# Patient Record
Sex: Female | Born: 1980 | Race: White | Hispanic: No | Marital: Married | Smoking: Never smoker
Health system: Southern US, Community
[De-identification: ages and names within clinical notes are randomized; demographics above are authoritative.]

## PROBLEM LIST (undated history)

## (undated) ENCOUNTER — Inpatient Hospital Stay (HOSPITAL_COMMUNITY): Payer: Self-pay

## (undated) DIAGNOSIS — F419 Anxiety disorder, unspecified: Secondary | ICD-10-CM

## (undated) DIAGNOSIS — Z789 Other specified health status: Secondary | ICD-10-CM

## (undated) DIAGNOSIS — O4290 Premature rupture of membranes, unspecified as to length of time between rupture and onset of labor, unspecified weeks of gestation: Secondary | ICD-10-CM

## (undated) DIAGNOSIS — O039 Complete or unspecified spontaneous abortion without complication: Secondary | ICD-10-CM

---

## 2012-07-16 NOTE — L&D Delivery Note (Signed)
Delivery Note At 8:02 AM a viable and healthy female was delivered via VBAC, Spontaneous (Presentation: Right Occiput Anterior).  APGAR: 9, 9; weight 8 lb 1 oz (3657 g).   Placenta status: Intact, Spontaneous.  Not sent Cord: 3 vessels with the following complications: .  Cord pH: none  Anesthesia: Epidural Local  Episiotomy:  Lacerations: 2nd degree;Perineal Suture Repair: 3.0 chromic Est. Blood Loss (mL): 300  Mom to postpartum.  Baby to Couplet care / Skin to Skin.  [name] A 07/13/2013, 9:50 AM

## 2012-12-18 LAB — OB RESULTS CONSOLE ABO/RH: RH Type: POSITIVE

## 2012-12-18 LAB — OB RESULTS CONSOLE HIV ANTIBODY (ROUTINE TESTING): HIV: NONREACTIVE

## 2012-12-18 LAB — OB RESULTS CONSOLE ANTIBODY SCREEN: Antibody Screen: NEGATIVE

## 2012-12-30 LAB — OB RESULTS CONSOLE GC/CHLAMYDIA
Chlamydia: NEGATIVE
Gonorrhea: NEGATIVE

## 2013-01-06 ENCOUNTER — Encounter (HOSPITAL_COMMUNITY): Payer: Self-pay

## 2013-01-06 ENCOUNTER — Inpatient Hospital Stay (HOSPITAL_COMMUNITY): Payer: BC Managed Care – PPO

## 2013-01-06 ENCOUNTER — Inpatient Hospital Stay (HOSPITAL_COMMUNITY)
Admission: AD | Admit: 2013-01-06 | Discharge: 2013-01-06 | Disposition: A | Discharge status: Home / Self Care | Payer: BC Managed Care – PPO | Source: Ambulatory Visit | Attending: Obstetrics & Gynecology | Admitting: Obstetrics & Gynecology

## 2013-01-06 DIAGNOSIS — O209 Hemorrhage in early pregnancy, unspecified: Secondary | ICD-10-CM | POA: Insufficient documentation

## 2013-01-06 HISTORY — DX: Complete or unspecified spontaneous abortion without complication: O03.9

## 2013-01-06 NOTE — MAU Note (Signed)
Dr. Seymour Bars at the bedside.

## 2013-01-06 NOTE — MAU Note (Signed)
Patient state she went to the bathroom about one hour ago and had bright bleeding. Denies pain.

## 2013-01-06 NOTE — MAU Note (Signed)
Dr. Seymour Bars notified of U/S results.  May Discharge pt to home.  Pelvic rest.

## 2013-02-04 NOTE — MAU Provider Note (Signed)
History  Mariah Marshall 32 y.o. [id number]      Chief Complaint  Patient presents with  . Vaginal Bleeding    Past Medical History  Diagnosis Date  . Miscarriage     Past Surgical History  Procedure Laterality Date  . Cesarean section      History reviewed. No pertinent family history.  History  Substance Use Topics  . Smoking status: Never Smoker   . Smokeless tobacco: Not on file  . Alcohol Use: No    Allergies: No Known Allergies  No prescriptions prior to admission     Physical Exam   Blood pressure 103/63, pulse 69, resp. rate 16, height 5\' 4"  (1.626 m), weight 56.155 kg (123 lb 12.8 oz), SpO2 91.00%.  Abdo wnl No vaginal bleeding. [place] SIUP 12+ wks, FHR 165/min, small SCH.  ED Course  Stable, no vaginal bleeding  A/P 12 5/7 wks with 1 episode of spotting.  No active bleeding.  [place] sIUP 12 5/7 wks FHR 165/min, small SCH.  Precautions discussed.  F/U [facility].  Genia Del MD

## 2013-07-03 LAB — OB RESULTS CONSOLE GBS: GBS: NEGATIVE

## 2013-07-13 ENCOUNTER — Inpatient Hospital Stay (HOSPITAL_COMMUNITY)
Admission: AD | Admit: 2013-07-13 | Discharge: 2013-07-14 | DRG: 775 | Disposition: A | Discharge status: Home / Self Care | Payer: BC Managed Care – PPO | Source: Ambulatory Visit | Attending: Obstetrics and Gynecology | Admitting: Obstetrics and Gynecology

## 2013-07-13 ENCOUNTER — Inpatient Hospital Stay (HOSPITAL_COMMUNITY): Payer: BC Managed Care – PPO | Admitting: Anesthesiology

## 2013-07-13 ENCOUNTER — Encounter (HOSPITAL_COMMUNITY): Payer: Self-pay

## 2013-07-13 ENCOUNTER — Encounter (HOSPITAL_COMMUNITY): Payer: BC Managed Care – PPO | Admitting: Anesthesiology

## 2013-07-13 DIAGNOSIS — O34219 Maternal care for unspecified type scar from previous cesarean delivery: Secondary | ICD-10-CM | POA: Diagnosis present

## 2013-07-13 HISTORY — DX: Other specified health status: Z78.9

## 2013-07-13 LAB — CBC
MCH: 30.6 pg (ref 26.0–34.0)
MCHC: 34.8 g/dL (ref 30.0–36.0)
Platelets: 173 10*3/uL (ref 150–400)
RBC: 4.87 MIL/uL (ref 3.87–5.11)
RDW: 14.2 % (ref 11.5–15.5)

## 2013-07-13 LAB — TYPE AND SCREEN
ABO/RH(D): A POS
Antibody Screen: NEGATIVE

## 2013-07-13 LAB — RPR: RPR Ser Ql: NONREACTIVE

## 2013-07-13 MED ORDER — EPHEDRINE 5 MG/ML INJ
10.0000 mg | INTRAVENOUS | Status: DC | PRN
  Filled 2013-07-13: qty 4
  Filled 2013-07-13: qty 2

## 2013-07-13 MED ORDER — PHENYLEPHRINE 40 MCG/ML (10ML) SYRINGE FOR IV PUSH (FOR BLOOD PRESSURE SUPPORT)
80.0000 ug | PREFILLED_SYRINGE | INTRAVENOUS | Status: DC | PRN
  Filled 2013-07-13: qty 10
  Filled 2013-07-13: qty 2

## 2013-07-13 MED ORDER — IBUPROFEN 600 MG PO TABS
600.0000 mg | ORAL_TABLET | Freq: Four times a day (QID) | ORAL | Status: DC
  Administered 2013-07-13 – 2013-07-14 (×5): 600 mg via ORAL
  Filled 2013-07-13 (×6): qty 1

## 2013-07-13 MED ORDER — LACTATED RINGERS IV SOLN: INTRAVENOUS | Status: DC

## 2013-07-13 MED ORDER — PHENYLEPHRINE 40 MCG/ML (10ML) SYRINGE FOR IV PUSH (FOR BLOOD PRESSURE SUPPORT)
80.0000 ug | PREFILLED_SYRINGE | INTRAVENOUS | Status: DC | PRN
  Filled 2013-07-13: qty 2

## 2013-07-13 MED ORDER — DIPHENHYDRAMINE HCL 25 MG PO CAPS: 25.0000 mg | ORAL_CAPSULE | Freq: Four times a day (QID) | ORAL | Status: DC | PRN

## 2013-07-13 MED ORDER — LIDOCAINE HCL (PF) 1 % IJ SOLN
INTRAMUSCULAR | Status: DC | PRN
  Administered 2013-07-13 (×3): 5 mL

## 2013-07-13 MED ORDER — CITRIC ACID-SODIUM CITRATE 334-500 MG/5ML PO SOLN: 30.0000 mL | ORAL | Status: DC | PRN

## 2013-07-13 MED ORDER — WITCH HAZEL-GLYCERIN EX PADS: 1.0000 "application " | MEDICATED_PAD | CUTANEOUS | Status: DC | PRN

## 2013-07-13 MED ORDER — OXYTOCIN 40 UNITS IN LACTATED RINGERS INFUSION - SIMPLE MED
62.5000 mL/h | INTRAVENOUS | Status: DC
  Filled 2013-07-13: qty 1000

## 2013-07-13 MED ORDER — EPHEDRINE 5 MG/ML INJ
10.0000 mg | INTRAVENOUS | Status: DC | PRN
  Filled 2013-07-13: qty 2

## 2013-07-13 MED ORDER — SIMETHICONE 80 MG PO CHEW: 80.0000 mg | CHEWABLE_TABLET | ORAL | Status: DC | PRN

## 2013-07-13 MED ORDER — FERROUS SULFATE 325 (65 FE) MG PO TABS
325.0000 mg | ORAL_TABLET | Freq: Two times a day (BID) | ORAL | Status: DC
  Administered 2013-07-13 – 2013-07-14 (×2): 325 mg via ORAL
  Filled 2013-07-13 (×2): qty 1

## 2013-07-13 MED ORDER — LANOLIN HYDROUS EX OINT: TOPICAL_OINTMENT | CUTANEOUS | Status: DC | PRN

## 2013-07-13 MED ORDER — ONDANSETRON HCL 4 MG/2ML IJ SOLN: 4.0000 mg | INTRAMUSCULAR | Status: DC | PRN

## 2013-07-13 MED ORDER — BENZOCAINE-MENTHOL 20-0.5 % EX AERO
1.0000 "application " | INHALATION_SPRAY | CUTANEOUS | Status: DC | PRN
  Administered 2013-07-13: 1 via TOPICAL
  Filled 2013-07-13: qty 56

## 2013-07-13 MED ORDER — OXYTOCIN BOLUS FROM INFUSION: 500.0000 mL | INTRAVENOUS | Status: DC

## 2013-07-13 MED ORDER — LACTATED RINGERS IV SOLN
500.0000 mL | Freq: Once | INTRAVENOUS | Status: AC
  Administered 2013-07-13: 08:00:00 via INTRAVENOUS

## 2013-07-13 MED ORDER — FENTANYL 2.5 MCG/ML BUPIVACAINE 1/10 % EPIDURAL INFUSION (WH - ANES)
14.0000 mL/h | INTRAMUSCULAR | Status: DC | PRN
  Filled 2013-07-13: qty 125

## 2013-07-13 MED ORDER — METHYLERGONOVINE MALEATE 0.2 MG PO TABS: 0.2000 mg | ORAL_TABLET | ORAL | Status: DC | PRN

## 2013-07-13 MED ORDER — ZOLPIDEM TARTRATE 5 MG PO TABS: 5.0000 mg | ORAL_TABLET | Freq: Every evening | ORAL | Status: DC | PRN

## 2013-07-13 MED ORDER — SENNOSIDES-DOCUSATE SODIUM 8.6-50 MG PO TABS
2.0000 | ORAL_TABLET | ORAL | Status: DC
  Administered 2013-07-14: 2 via ORAL
  Filled 2013-07-13: qty 2

## 2013-07-13 MED ORDER — LIDOCAINE HCL (PF) 1 % IJ SOLN
30.0000 mL | INTRAMUSCULAR | Status: DC | PRN
  Administered 2013-07-13: 30 mL via SUBCUTANEOUS
  Filled 2013-07-13 (×2): qty 30

## 2013-07-13 MED ORDER — DIPHENHYDRAMINE HCL 50 MG/ML IJ SOLN: 12.5000 mg | INTRAMUSCULAR | Status: DC | PRN

## 2013-07-13 MED ORDER — FENTANYL 2.5 MCG/ML BUPIVACAINE 1/10 % EPIDURAL INFUSION (WH - ANES)
INTRAMUSCULAR | Status: DC | PRN
  Administered 2013-07-13: 14 mL/h via EPIDURAL

## 2013-07-13 MED ORDER — FLEET ENEMA 7-19 GM/118ML RE ENEM: 1.0000 | ENEMA | RECTAL | Status: DC | PRN

## 2013-07-13 MED ORDER — ONDANSETRON HCL 4 MG PO TABS: 4.0000 mg | ORAL_TABLET | ORAL | Status: DC | PRN

## 2013-07-13 MED ORDER — METHYLERGONOVINE MALEATE 0.2 MG/ML IJ SOLN: 0.2000 mg | INTRAMUSCULAR | Status: DC | PRN

## 2013-07-13 MED ORDER — ONDANSETRON HCL 4 MG/2ML IJ SOLN: 4.0000 mg | Freq: Four times a day (QID) | INTRAMUSCULAR | Status: DC | PRN

## 2013-07-13 MED ORDER — IBUPROFEN 600 MG PO TABS: 600.0000 mg | ORAL_TABLET | Freq: Four times a day (QID) | ORAL | Status: DC | PRN

## 2013-07-13 MED ORDER — OXYCODONE-ACETAMINOPHEN 5-325 MG PO TABS: 1.0000 | ORAL_TABLET | ORAL | Status: DC | PRN

## 2013-07-13 MED ORDER — PRENATAL MULTIVITAMIN CH
1.0000 | ORAL_TABLET | Freq: Every day | ORAL | Status: DC
  Administered 2013-07-13 – 2013-07-14 (×2): 1 via ORAL
  Filled 2013-07-13 (×2): qty 1

## 2013-07-13 MED ORDER — LACTATED RINGERS IV SOLN: 500.0000 mL | INTRAVENOUS | Status: DC | PRN

## 2013-07-13 MED ORDER — DIBUCAINE 1 % RE OINT: 1.0000 "application " | TOPICAL_OINTMENT | RECTAL | Status: DC | PRN

## 2013-07-13 MED ORDER — ACETAMINOPHEN 325 MG PO TABS: 650.0000 mg | ORAL_TABLET | ORAL | Status: DC | PRN

## 2013-07-13 NOTE — H&P (Signed)
Mariah Marshall is a 32 y.o. female presenting for admission due to active labor. Intact membrane  History OB History   Grav Para Term Preterm Abortions TAB SAB Ect Mult Living   5 2 2  2  2   2      Past Medical History  Diagnosis Date  . Miscarriage   . Medical history non-contributory    Past Surgical History  Procedure Laterality Date  . Cesarean section     Family History: family history is not on file. Social History:  reports that she has never smoked. She does not have any smokeless tobacco history on file. She reports that she does not drink alcohol or use illicit drugs.   Prenatal Transfer Tool  Maternal Diabetes: No Genetic Screening: Normal Maternal Ultrasounds/Referrals: Normal Fetal Ultrasounds or other Referrals:  None Maternal Substance Abuse:  No Significant Maternal Medications:  None Significant Maternal Lab Results:  Lab values include: Group B Strep negative Other Comments:  None  ROS  Dilation: Lip/rim Effacement (%): 90 Station: 0 Exam by:: A.Davis,RN VE fully/ bulging membrane /-2/-1  AROM clear fluid  Blood pressure 95/67, pulse 82, temperature 97.6 F (36.4 C), temperature source Oral, resp. rate 18, height 5\' 4"  (1.626 m), weight 68.493 kg (151 lb), SpO2 99.00%. Exam Physical Exam  Constitutional: She is oriented to person, place, and time. She appears well-developed and well-nourished.  HENT:  Head: Atraumatic.  Eyes: EOM are normal.  Neck: Neck supple.  Respiratory: Breath sounds normal.  GI: Soft.  Musculoskeletal: Normal range of motion. She exhibits no edema.  Neurological: She is alert and oriented to person, place, and time.  Skin: Skin is warm and dry.  Psychiatric: She has a normal mood and affect.    Prenatal labs: ABO, Rh: A/Positive/-- (06/05 0000) Antibody: Negative (06/05 0000) Rubella: Immune (06/05 0000) RPR: Nonreactive (06/05 0000)  HBsAg: Negative (06/05 0000)  HIV: Non-reactive (06/05 0000)  GBS: Negative  (12/29 0000)    tracing baseline 120 (+) accel reactive q 2 mins  Assessment/Plan: Active labor VBAC P) admit routine labs. Amniotomy done  epidural   Walt Geathers A 07/13/2013, 7:06 AM

## 2013-07-13 NOTE — Anesthesia Postprocedure Evaluation (Signed)
Anesthesia Post Note  Patient: Mariah Marshall  Procedure(s) Performed: * No procedures listed *  Anesthesia type: Epidural  Patient location: Mother/Baby  Post pain: Pain level controlled  Post assessment: Post-op Vital signs reviewed  Last Vitals:  Filed Vitals:   07/13/13 1500  BP: 98/63  Pulse: 78  Temp: 36.6 C  Resp: 20    Post vital signs: Reviewed  Level of consciousness:alert  Complications: No apparent anesthesia complications

## 2013-07-13 NOTE — Anesthesia Procedure Notes (Signed)
Epidural Patient location during procedure: OB  Staffing Anesthesiologist: Phillips Grout Performed by: anesthesiologist   Preanesthetic Checklist Completed: patient identified, site marked, surgical consent, pre-op evaluation, timeout performed, IV checked, risks and benefits discussed and monitors and equipment checked  Epidural Patient position: sitting Prep: ChloraPrep Patient monitoring: heart rate, continuous pulse ox and blood pressure Approach: right paramedian Injection technique: LOR saline  Needle:  Needle type: Tuohy  Needle gauge: 17 G Needle length: 9 cm and 9 Needle insertion depth: 4 cm Catheter type: closed end flexible Catheter size: 20 Guage Catheter at skin depth: 10 cm Test dose: negative  Assessment Events: blood not aspirated, injection not painful, no injection resistance, negative IV test and no paresthesia  Additional Notes   Patient tolerated the insertion well without complications.

## 2013-07-13 NOTE — MAU Note (Signed)
Contractions since 3am. Denies LOF or VB. Positive fetal movement. Denies complications with pregnancy.

## 2013-07-13 NOTE — Anesthesia Preprocedure Evaluation (Signed)

## 2013-07-14 LAB — CBC
HCT: 34.4 % — ABNORMAL LOW (ref 36.0–46.0)
Hemoglobin: 11.7 g/dL — ABNORMAL LOW (ref 12.0–15.0)
MCH: 30.4 pg (ref 26.0–34.0)
MCHC: 34 g/dL (ref 30.0–36.0)
MCV: 89.4 fL (ref 78.0–100.0)
RDW: 14.3 % (ref 11.5–15.5)
WBC: 11.1 10*3/uL — ABNORMAL HIGH (ref 4.0–10.5)

## 2013-07-14 MED ORDER — IBUPROFEN 600 MG PO TABS: 600.0000 mg | ORAL_TABLET | Freq: Four times a day (QID) | ORAL | Status: DC

## 2013-07-14 NOTE — Progress Notes (Signed)
PPD #1- SVD  Subjective:   Reports feeling good, wants early discharge Tolerating po/ No nausea or vomiting Bleeding is light Pain controlled with Motrin Up ad lib / ambulatory / voiding without problems Newborn: breastfeeding  / Circumcision: done   Objective:   VS: VS:  Filed Vitals:   07/13/13 1105 07/13/13 1500 07/13/13 2200 07/14/13 0619  BP: 100/65 98/63 106/73 101/67  Pulse: 76 78 82 77  Temp: 98.6 F (37 C) 97.9 F (36.6 C) 98.4 F (36.9 C) 97.6 F (36.4 C)  TempSrc: Oral   Oral  Resp: 18 20 20 20   Height:      Weight:      SpO2:    95%    LABS:  Recent Labs  07/13/13 0600 07/14/13 0705  WBC 10.1 11.1*  HGB 14.9 11.7*  PLT 173 163   Blood type: --/--/A POS, A POS (12/29 0600) Rubella: Immune (06/05 0000)                I&O: Intake/Output     12/29 0701 - 12/30 0700 12/30 0701 - 12/31 0700   Urine (mL/kg/hr) 100 (0.1)    Blood 300 (0.2)    Total Output 400     Net -400          Urine Occurrence 1 x      Physical Exam: Alert and oriented X3 Abdomen: soft, non-tender, non-distended  Fundus: firm, non-tender, U-2 Perineum: Well approximated, no significant erythema, edema, or drainage; healing well. Lochia: small Extremities: no edema, no calf pain or tenderness    Assessment: PPD # 1 [id number]/ S/P:SVD, 2nd degree laceration Doing well - stable for discharge home   Plan: Discharge home RX's:  Ibuprofen 600mg  po Q 6 hrs prn pain #30 Refill x 0 Routine pp visit in [facility] booklet given    Donette Larry, N MSN, CNM 07/14/2013, 10:00 AM

## 2013-07-14 NOTE — Discharge Summary (Signed)
Obstetric Discharge Summary Reason for Admission: onset of labor Prenatal Procedures: ultrasound Intrapartum Procedures: spontaneous vaginal delivery Postpartum Procedures: none Complications-Operative and Postpartum: 2nd degree perineal laceration Hemoglobin  Date Value Range Status  07/14/2013 11.7* 12.0 - 15.0 g/dL Final     DELTA CHECK NOTED     REPEATED TO VERIFY     HCT  Date Value Range Status  07/14/2013 34.4* 36.0 - 46.0 % Final    Physical Exam:  General: alert and cooperative Lochia: appropriate Uterine Fundus: firm Incision: healing well, no significant drainage, no dehiscence, no significant erythema DVT Evaluation: No evidence of DVT seen on physical exam. Negative Homan's sign. No cords or calf tenderness. No significant calf/ankle edema.  Discharge Diagnoses: Term Pregnancy-delivered  Discharge Information: Date: 07/14/2013 Activity: pelvic rest Diet: routine Medications: PNV and Ibuprofen Condition: stable Instructions: refer to practice specific booklet Discharge to: home Follow-up Information   Follow up with MODY,VAISHALI R, MD. Schedule an appointment as soon as possible for a visit in 6 weeks.   Specialty:  Obstetrics and Gynecology   Contact information:   [address] [place] [address] [phone]       Newborn Data: Live born female on 07/13/13 Birth Weight: 8 lb 1 oz (3657 g) APGAR: 9, 9  Home with mother.  Sloan Takagi, N 07/14/2013, 10:03 AM

## 2014-05-14 IMAGING — US US OB COMP LESS 14 WK
1 series · 14 of 28 positions shown · non-contrast
Comparison: None.

CLINICAL DATA: Vaginal bleeding and pregnancy.

OBSTETRIC <14 WK ULTRASOUND
TECHNIQUE: Transabdominal ultrasound was performed for evaluation
of the gestation as well as the maternal uterus and adnexal
regions.

[Series 1: us ob comp less 14 wks · 29 acquisitions, 14 frames shown]
[im 2/29]
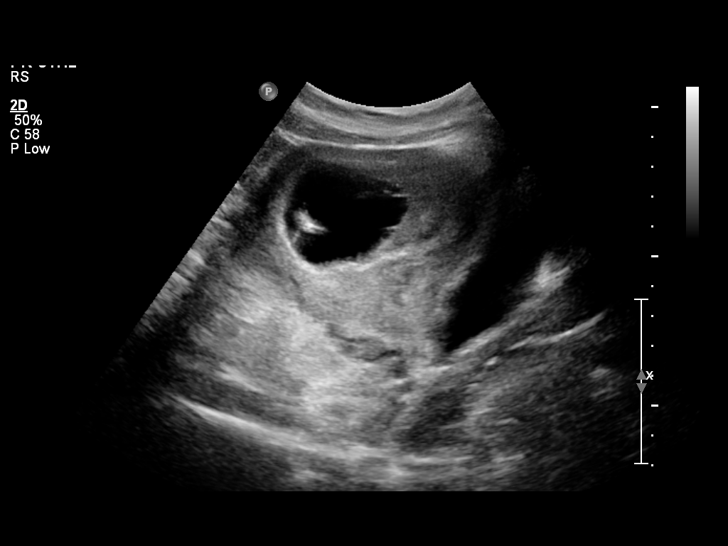
[im 4/29]
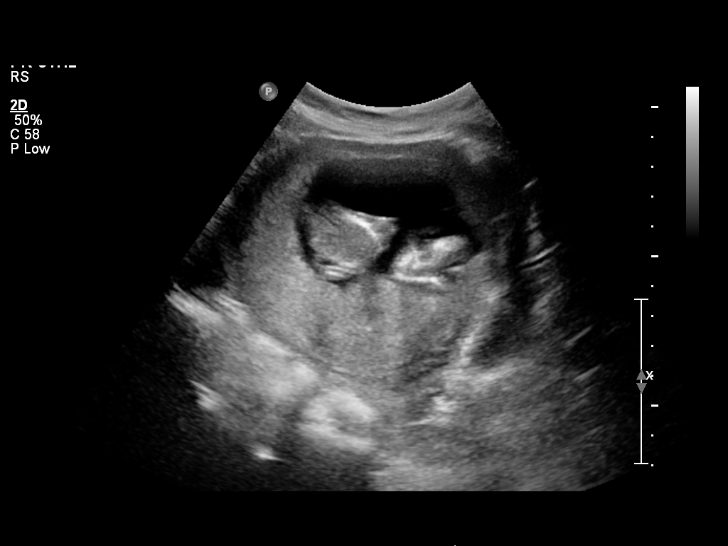
[im 6/29]
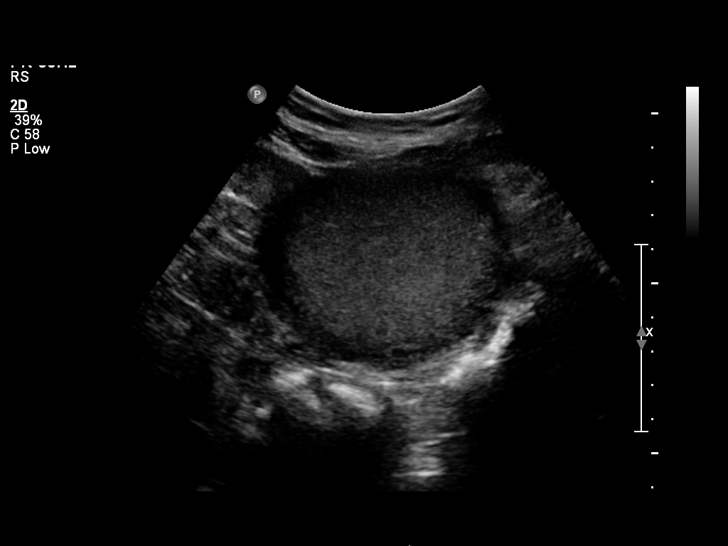
[im 8/29]
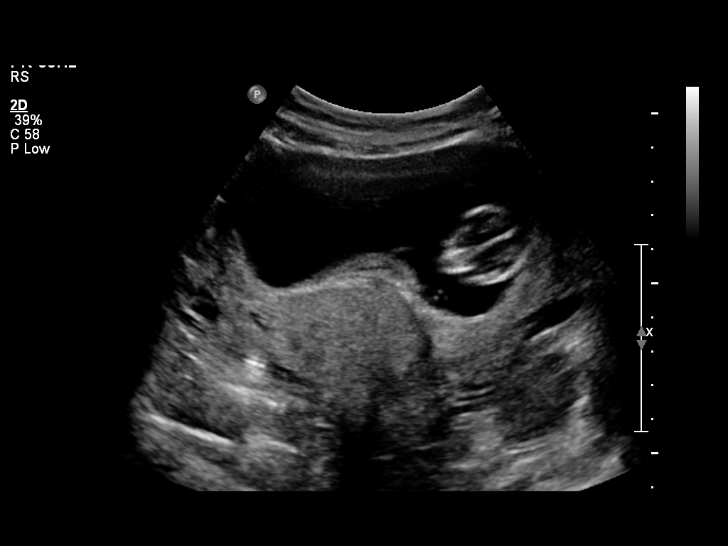
[im 10/29]
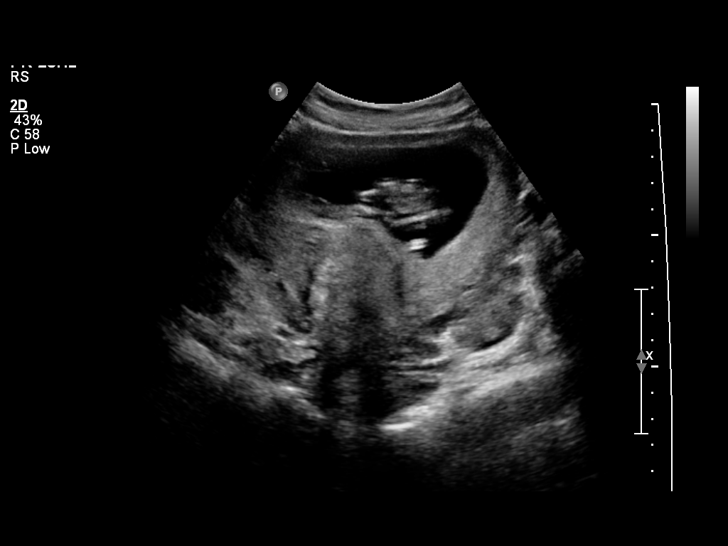
[im 12/29]
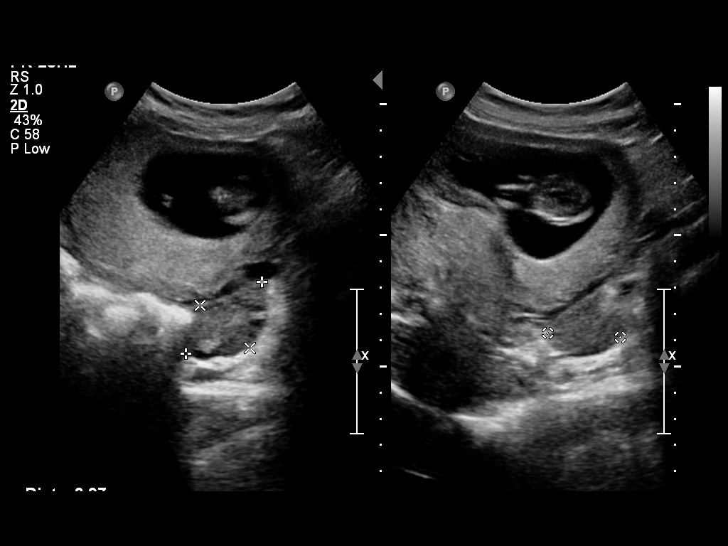
[im 14/29]
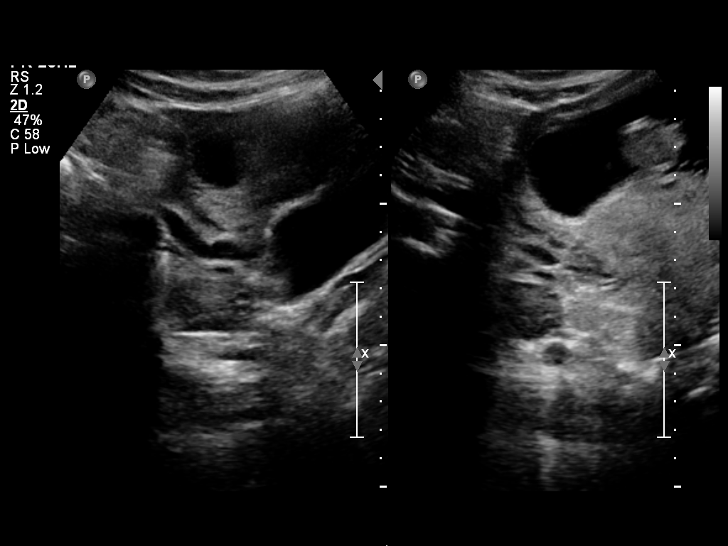
[im 16/29]
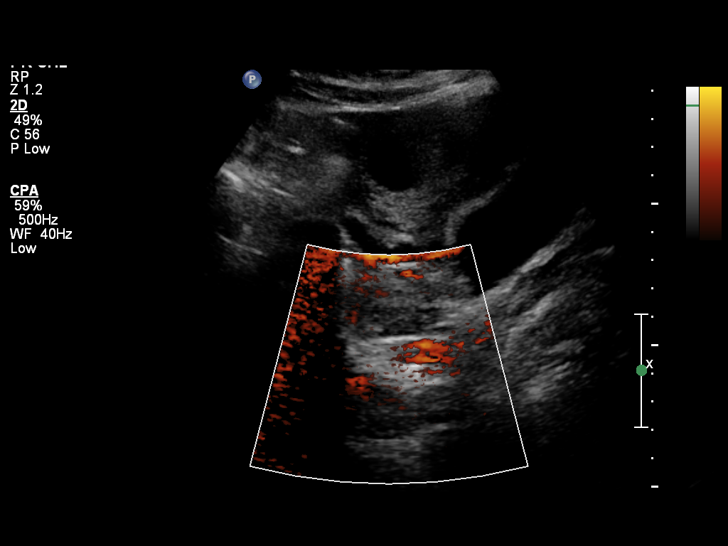
[im 18/29]
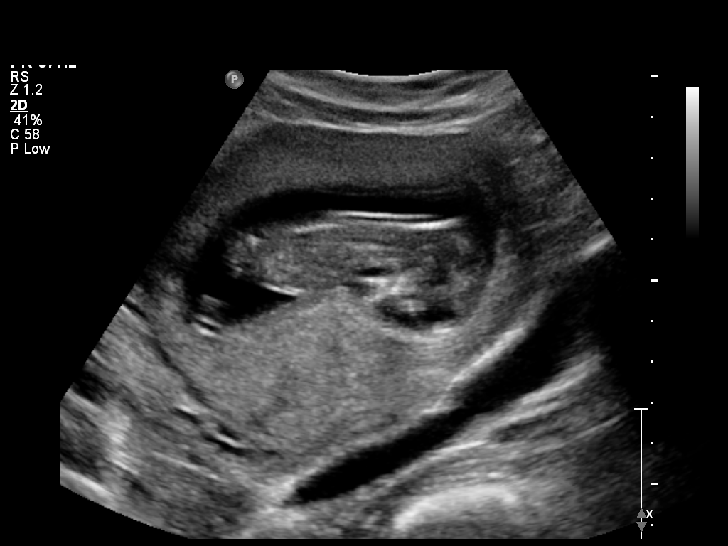
[im 20/29]
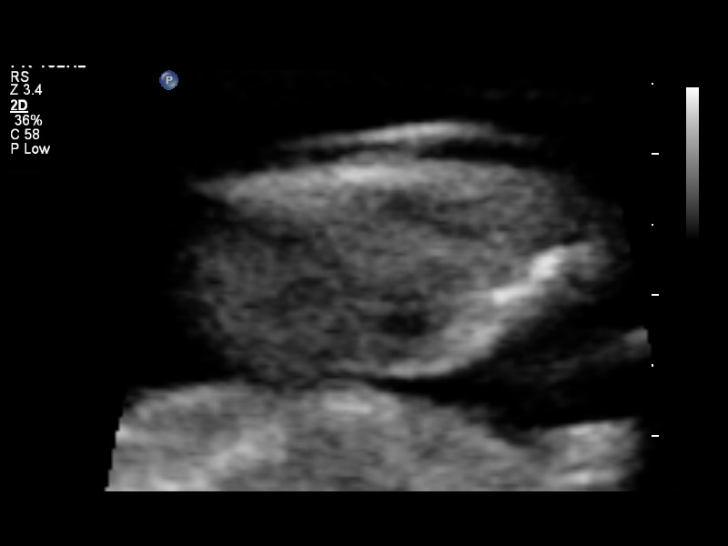
[im 22/29]
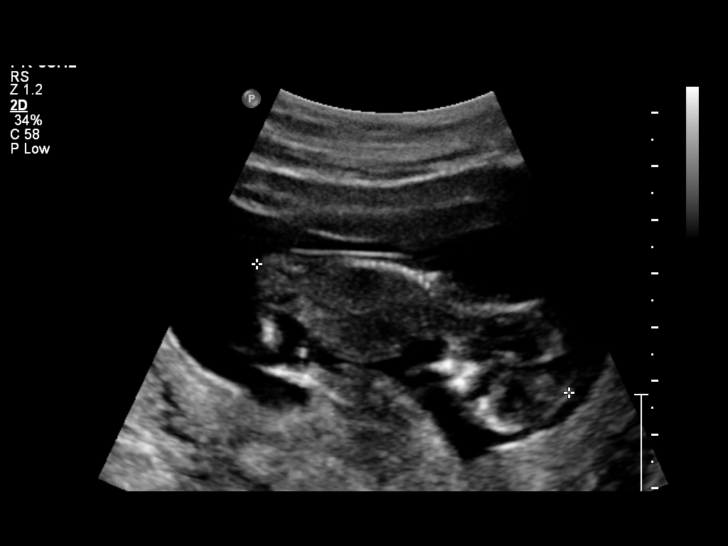
[im 24/29]
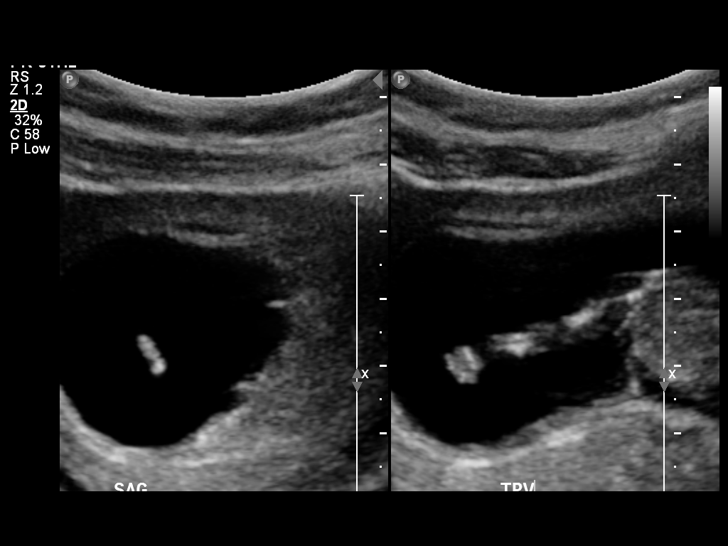
[im 26/29]
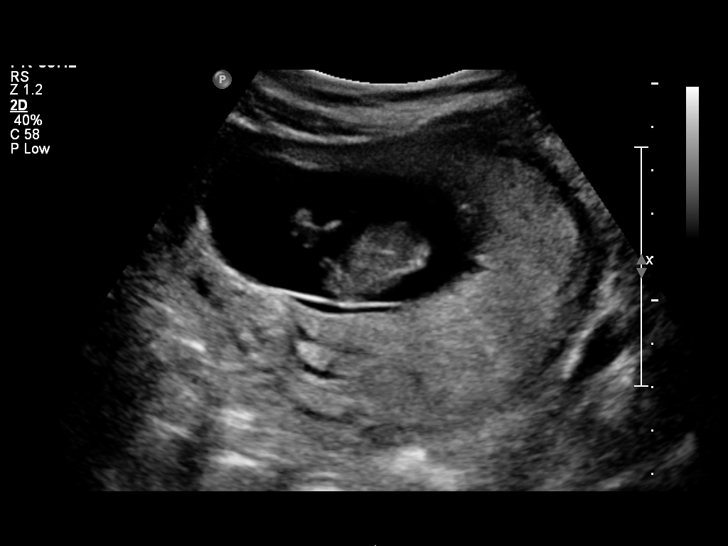
[im 29/29]
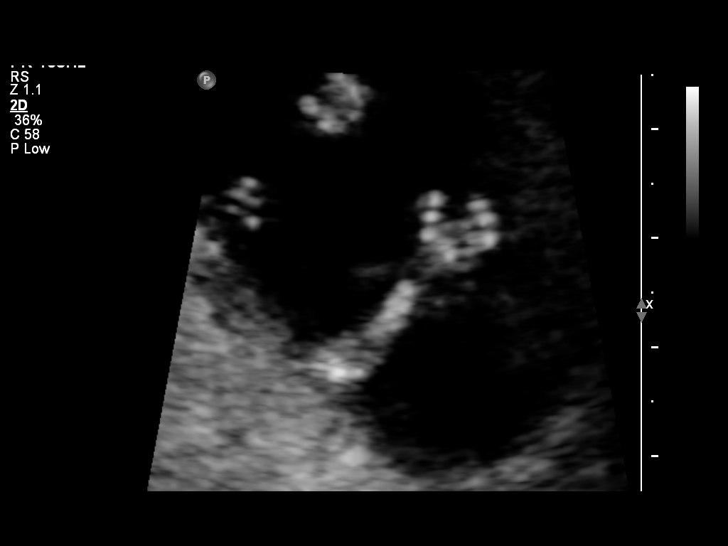

[14 of 28 positions shown; findings below may reference images not displayed]

Intrauterine gestational sac: Single.
Yolk sac: No
Embryo: Yes
Cardiac Activity: Yes
Heart Rate: 165 bpm

CRL:  62 mm  12 w  5 d        US EDC: 07/16/2013

Maternal uterus/Adnexae:
Small subchorionic hemorrhage.

The ovaries are normal.  No free fluid.
IMPRESSION: Single intrauterine pregnancy approximately 12 weeks 5 days
gestation.  Small subchorionic hemorrhage.

## 2014-05-17 ENCOUNTER — Encounter (HOSPITAL_COMMUNITY): Payer: Self-pay

## 2014-07-16 NOTE — L&D Delivery Note (Signed)
Delivery Note At 6:34 PM a viable and healthy female was delivered via VBAC, Spontaneous (Presentation: OA ).  APGAR: 8, 9; weight  pending  Placenta status: Intact, Spontaneous.  Cord: 3 vessels with the following complications: None.  Cord pH: n/a  Anesthesia: Epidural  Episiotomy: None Lacerations: 1st degree Suture Repair: 3.0 vicryl rapide Est. Blood Loss (mL):  200 cc  Mom to postpartum.  Baby to Couplet care / Skin to Skin.  Adysson Revelle R 02/06/2015, 6:54 PM

## 2014-07-21 LAB — OB RESULTS CONSOLE ANTIBODY SCREEN: Antibody Screen: NEGATIVE

## 2014-07-21 LAB — OB RESULTS CONSOLE RUBELLA ANTIBODY, IGM: RUBELLA: IMMUNE

## 2014-07-21 LAB — OB RESULTS CONSOLE HEPATITIS B SURFACE ANTIGEN: Hepatitis B Surface Ag: NEGATIVE

## 2014-07-21 LAB — OB RESULTS CONSOLE ABO/RH: RH TYPE: POSITIVE

## 2014-07-21 LAB — OB RESULTS CONSOLE RPR: RPR: NONREACTIVE

## 2014-07-21 LAB — OB RESULTS CONSOLE HIV ANTIBODY (ROUTINE TESTING): HIV: NONREACTIVE

## 2014-07-29 LAB — OB RESULTS CONSOLE GC/CHLAMYDIA
CHLAMYDIA, DNA PROBE: NEGATIVE
GC PROBE AMP, GENITAL: NEGATIVE

## 2015-01-20 LAB — OB RESULTS CONSOLE GBS: GBS: NEGATIVE

## 2015-01-31 ENCOUNTER — Observation Stay (HOSPITAL_COMMUNITY)
Admission: RE | Admit: 2015-01-31 | Discharge: 2015-01-31 | Disposition: A | Discharge status: Home / Self Care | Payer: BLUE CROSS/BLUE SHIELD | Source: Ambulatory Visit | Attending: Obstetrics & Gynecology | Admitting: Obstetrics & Gynecology

## 2015-01-31 ENCOUNTER — Encounter (HOSPITAL_COMMUNITY): Payer: Self-pay

## 2015-01-31 DIAGNOSIS — O321XX0 Maternal care for breech presentation, not applicable or unspecified: Secondary | ICD-10-CM | POA: Diagnosis not present

## 2015-01-31 DIAGNOSIS — Z3A38 38 weeks gestation of pregnancy: Secondary | ICD-10-CM | POA: Diagnosis not present

## 2015-01-31 MED ORDER — TERBUTALINE SULFATE 1 MG/ML IJ SOLN
0.2500 mg | Freq: Once | INTRAMUSCULAR | Status: AC
  Administered 2015-01-31: 0.25 mg via SUBCUTANEOUS

## 2015-01-31 MED ORDER — TERBUTALINE SULFATE 1 MG/ML IJ SOLN
INTRAMUSCULAR | Status: AC
  Filled 2015-01-31: qty 1

## 2015-01-31 MED ORDER — CITRIC ACID-SODIUM CITRATE 334-500 MG/5ML PO SOLN
ORAL | Status: AC
  Filled 2015-01-31: qty 15

## 2015-01-31 NOTE — Discharge Instructions (Signed)
Braxton Hicks Contractions °Contractions of the uterus can occur throughout pregnancy. Contractions are not always a sign that you are in labor.  °WHAT ARE BRAXTON HICKS CONTRACTIONS?  °Contractions that occur before labor are called Braxton Hicks contractions, or false labor. Toward the end of pregnancy (32-34 weeks), these contractions can develop more often and may become more forceful. This is not true labor because these contractions do not result in opening (dilatation) and thinning of the cervix. They are sometimes difficult to tell apart from true labor because these contractions can be forceful and people have different pain tolerances. You should not feel embarrassed if you go to the hospital with false labor. Sometimes, the only way to tell if you are in true labor is for your health care provider to look for changes in the cervix. °If there are no prenatal problems or other health problems associated with the pregnancy, it is completely safe to be sent home with false labor and await the onset of true labor. °HOW CAN YOU TELL THE DIFFERENCE BETWEEN TRUE AND FALSE LABOR? °False Labor °· The contractions of false labor are usually shorter and not as hard as those of true labor.   °· The contractions are usually irregular.   °· The contractions are often felt in the front of the lower abdomen and in the groin.   °· The contractions may go away when you walk around or change positions while lying down.   °· The contractions get weaker and are shorter lasting as time goes on.   °· The contractions do not usually become progressively stronger, regular, and closer together as with true labor.   °True Labor °· Contractions in true labor last 30-70 seconds, become very regular, usually become more intense, and increase in frequency.   °· The contractions do not go away with walking.   °· The discomfort is usually felt in the top of the uterus and spreads to the lower abdomen and low back.   °· True labor can be  determined by your health care provider with an exam. This will show that the cervix is dilating and getting thinner.   °WHAT TO REMEMBER °· Keep up with your usual exercises and follow other instructions given by your health care provider.   °· Take medicines as directed by your health care provider.   °· Keep your regular prenatal appointments.   °· Eat and drink lightly if you think you are going into labor.   °· If Braxton Hicks contractions are making you uncomfortable:   °¨ Change your position from lying down or resting to walking, or from walking to resting.   °¨ Sit and rest in a tub of warm water.   °¨ Drink 2-3 glasses of water. Dehydration may cause these contractions.   °¨ Do slow and deep breathing several times an hour.   °WHEN SHOULD I SEEK IMMEDIATE MEDICAL CARE? °Seek immediate medical care if: °· Your contractions become stronger, more regular, and closer together.   °· You have fluid leaking or gushing from your vagina.   °· You have a fever.   °· You pass blood-tinged mucus.   °· You have vaginal bleeding.   °· You have continuous abdominal pain.   °· You have low back pain that you never had before.   °· You feel your baby's head pushing down and causing pelvic pressure.   °· Your baby is not moving as much as it used to.   °Document Released: 07/02/2005 Document Revised: 07/07/2013 Document Reviewed: 04/13/2013 °Exit[employer]® Patient Information ©2015 [employer]. This information is not intended to replace advice given to you by your health care   provider. Make sure you discuss any questions you have with your health care provider. °Fetal Movement Counts °Patient Name: __________________________________________________ Patient Due Date: ____________________ °Performing a fetal movement count is highly recommended in high-risk pregnancies, but it is good for every pregnant woman to do. Your health care provider may ask you to start counting fetal movements at 28 weeks of the pregnancy. Fetal  movements often increase: °· After eating a full meal. °· After physical activity. °· After eating or drinking something sweet or cold. °· At rest. °Pay attention to when you feel the baby is most active. This will help you notice a pattern of your baby's sleep and wake cycles and what factors contribute to an increase in fetal movement. It is important to perform a fetal movement count at the same time each day when your baby is normally most active.  °HOW TO COUNT FETAL MOVEMENTS °· Find a quiet and comfortable area to sit or lie down on your left side. Lying on your left side provides the best blood and oxygen circulation to your baby. °· Write down the day and time on a sheet of paper or in a journal. °· Start counting kicks, flutters, swishes, rolls, or jabs in a 2-hour period. You should feel at least 10 movements within 2 hours. °· If you do not feel 10 movements in 2 hours, wait 2-3 hours and count again. Look for a change in the pattern or not enough counts in 2 hours. °SEEK MEDICAL CARE IF: °· You feel less than 10 counts in 2 hours, tried twice. °· There is no movement in over an hour. °· The pattern is changing or taking longer each day to reach 10 counts in 2 hours. °· You feel the baby is not moving as he or she usually does. °Date: ____________ Movements: ____________ Start time: ____________ Finish time: ____________  °Date: ____________ Movements: ____________ Start time: ____________ Finish time: ____________ °Date: ____________ Movements: ____________ Start time: ____________ Finish time: ____________ °Date: ____________ Movements: ____________ Start time: ____________ Finish time: ____________ °Date: ____________ Movements: ____________ Start time: ____________ Finish time: ____________ °Date: ____________ Movements: ____________ Start time: ____________ Finish time: ____________ °Date: ____________ Movements: ____________ Start time: ____________ Finish time: ____________ °Date: ____________  Movements: ____________ Start time: ____________ Finish time: ____________  °Date: ____________ Movements: ____________ Start time: ____________ Finish time: ____________ °Date: ____________ Movements: ____________ Start time: ____________ Finish time: ____________ °Date: ____________ Movements: ____________ Start time: ____________ Finish time: ____________ °Date: ____________ Movements: ____________ Start time: ____________ Finish time: ____________ °Date: ____________ Movements: ____________ Start time: ____________ Finish time: ____________ °Date: ____________ Movements: ____________ Start time: ____________ Finish time: ____________ °Date: ____________ Movements: ____________ Start time: ____________ Finish time: ____________  °Date: ____________ Movements: ____________ Start time: ____________ Finish time: ____________ °Date: ____________ Movements: ____________ Start time: ____________ Finish time: ____________ °Date: ____________ Movements: ____________ Start time: ____________ Finish time: ____________ °Date: ____________ Movements: ____________ Start time: ____________ Finish time: ____________ °Date: ____________ Movements: ____________ Start time: ____________ Finish time: ____________ °Date: ____________ Movements: ____________ Start time: ____________ Finish time: ____________ °Date: ____________ Movements: ____________ Start time: ____________ Finish time: ____________  °Date: ____________ Movements: ____________ Start time: ____________ Finish time: ____________ °Date: ____________ Movements: ____________ Start time: ____________ Finish time: ____________ °Date: ____________ Movements: ____________ Start time: ____________ Finish time: ____________ °Date: ____________ Movements: ____________ Start time: ____________ Finish time: ____________ °Date: ____________ Movements: ____________ Start time: ____________ Finish time: ____________ °Date: ____________ Movements: ____________ Start time:  ____________ Finish time: ____________ °Date: ____________ Movements: ____________   Start time: ____________ Finish time: ____________  °Date: ____________ Movements: ____________ Start time: ____________ Finish time: ____________ °Date: ____________ Movements: ____________ Start time: ____________ Finish time: ____________ °Date: ____________ Movements: ____________ Start time: ____________ Finish time: ____________ °Date: ____________ Movements: ____________ Start time: ____________ Finish time: ____________ °Date: ____________ Movements: ____________ Start time: ____________ Finish time: ____________ °Date: ____________ Movements: ____________ Start time: ____________ Finish time: ____________ °Date: ____________ Movements: ____________ Start time: ____________ Finish time: ____________  °Date: ____________ Movements: ____________ Start time: ____________ Finish time: ____________ °Date: ____________ Movements: ____________ Start time: ____________ Finish time: ____________ °Date: ____________ Movements: ____________ Start time: ____________ Finish time: ____________ °Date: ____________ Movements: ____________ Start time: ____________ Finish time: ____________ °Date: ____________ Movements: ____________ Start time: ____________ Finish time: ____________ °Date: ____________ Movements: ____________ Start time: ____________ Finish time: ____________ °Date: ____________ Movements: ____________ Start time: ____________ Finish time: ____________  °Date: ____________ Movements: ____________ Start time: ____________ Finish time: ____________ °Date: ____________ Movements: ____________ Start time: ____________ Finish time: ____________ °Date: ____________ Movements: ____________ Start time: ____________ Finish time: ____________ °Date: ____________ Movements: ____________ Start time: ____________ Finish time: ____________ °Date: ____________ Movements: ____________ Start time: ____________ Finish time: ____________ °Date:  ____________ Movements: ____________ Start time: ____________ Finish time: ____________ °Date: ____________ Movements: ____________ Start time: ____________ Finish time: ____________  °Date: ____________ Movements: ____________ Start time: ____________ Finish time: ____________ °Date: ____________ Movements: ____________ Start time: ____________ Finish time: ____________ °Date: ____________ Movements: ____________ Start time: ____________ Finish time: ____________ °Date: ____________ Movements: ____________ Start time: ____________ Finish time: ____________ °Date: ____________ Movements: ____________ Start time: ____________ Finish time: ____________ °Date: ____________ Movements: ____________ Start time: ____________ Finish time: ____________ °Document Released: 08/01/2006 Document Revised: 11/16/2013 Document Reviewed: 04/28/2012 °Exit[employer]® Patient Information ©2015 [employer]. This information is not intended to replace advice given to you by your health care provider. Make sure you discuss any questions you have with your health care provider. ° °

## 2015-01-31 NOTE — H&P (Addendum)
34 yo female, [id number], at 38 wks, here for External cephalic version (ECV) for breech presentation. Patient with prior C/s (1st baby) for breech, then 2 successful VBACs, this baby was cephalic but noted to have turned breech at last visit on 7/15, so ECV was offered, risks/ benefits reviewed and patient desired to proceed with version. MedHx- negative, healthy female.  Uncomplicated pregnancy. [facility]- Dr Juliene PinaMody primary. Adequate pelvis, Frank breech, adequate AFI on 7/15, not in labor, posterior placenta, once LTCS scar on the uterus.   BP 112/70 mmHg  Pulse 107  Temp(Src) 97.8 F (36.6 C) (Axillary)  Resp 16  Ht 5\' 4"  (1.626 m)  Wt 154 lb (69.854 kg)  BMI 26.42 kg/m2   A&O x 3, no acute distress. Pleasant HEENT neg Lungs CTA bilat CV RRR, S1S2 normal Abdo soft, non tender, non acute Extr no edema/ tenderness Pelvic not checked today but last exam in office 7/15, 1/50%/ floating presentation FHT 145/ + accels/ no decels/ moderate variab Toco rare  PNLabs normal, GBS neg. A(+).   A/P: Breech, confirmed on bedside sono. Procedure reviewed. Defer IV line since good veins.  Terbutaline 0.25mg  subcu. Informed written consent for External cephalic version as well as emergency C-section.  Reviewed risks including fetal distress/ abruption/ uterine rupture/ labor.   V.Jovin Fester MD

## 2015-01-31 NOTE — Discharge Summary (Signed)
Admission Diagnosis: 38 wks pregnancy. Breech presentation Discharge diagnosis: Successful version to Cephalic presentation Procedure: External Cephalic Version on 01/31/15 Admission and Discharge MD: Shea EvansVaishali Ronson Hagins Course: uncomplicated, FHT- category I Disposition: Home  F/up: Office on 7/22.

## 2015-01-31 NOTE — Progress Notes (Signed)
Patient ID: Lissa HoardJennifer Salvador, female   DOB: [birth year], 34 y.o.   MRN: [id number] PROCEDURE NOTE:  Diagnosis: 38 wks, Complete Breech presentation Procedure: External Cephalic Version Surgeon: Susa DayVaishali Kedrick Mcnamee,MD Assistant: Philipp Ovensollita Dawson, CNM  Anesthesia: none  Procedure: Patient have informed written consent. Bedside sono confirmed baby in complete breech position with head in RUQ and back to maternal left and buttocks in suprapubic area but floating ballotable. Uterus soft, relaxed. Subcu terbutaline given, 30 minutes interval given and procedure started.   Head was pushed down in anti-clockwise direction while lifting the breech from low midline to the left. Intermittent check noted normal fetal cardiac activity on sono. With gentle control and pressure, External cephalic version was completed successfully. Tight strap placed above the fundus to prevent baby from turnign again. Uterus remained soft, non tender.  Post procedure- FHT monitoring noted 150/ moderate variability/ no decels/ accels noted. Category I Toco - occasional contraction noted.   Patient tolerated procedure well, fetal position checked with bedside sono in 45 min post-procedure, remains cephalic.   Patient will be discharged home, f/up with me in office on 7/22, consider IOL at 39 wks if favorable.   V.Erie Sica, MD

## 2015-02-06 ENCOUNTER — Inpatient Hospital Stay (HOSPITAL_COMMUNITY): Payer: BLUE CROSS/BLUE SHIELD | Admitting: Anesthesiology

## 2015-02-06 ENCOUNTER — Encounter (HOSPITAL_COMMUNITY): Payer: Self-pay | Admitting: Obstetrics and Gynecology

## 2015-02-06 ENCOUNTER — Inpatient Hospital Stay (HOSPITAL_COMMUNITY)
Admission: AD | Admit: 2015-02-06 | Discharge: 2015-02-08 | DRG: 775 | Disposition: A | Discharge status: Home / Self Care | Payer: BLUE CROSS/BLUE SHIELD | Source: Ambulatory Visit | Attending: Obstetrics & Gynecology | Admitting: Obstetrics & Gynecology

## 2015-02-06 DIAGNOSIS — O3421 Maternal care for scar from previous cesarean delivery: Secondary | ICD-10-CM | POA: Diagnosis present

## 2015-02-06 DIAGNOSIS — Z3A38 38 weeks gestation of pregnancy: Secondary | ICD-10-CM | POA: Diagnosis present

## 2015-02-06 DIAGNOSIS — O4290 Premature rupture of membranes, unspecified as to length of time between rupture and onset of labor, unspecified weeks of gestation: Secondary | ICD-10-CM

## 2015-02-06 DIAGNOSIS — O9989 Other specified diseases and conditions complicating pregnancy, childbirth and the puerperium: Secondary | ICD-10-CM | POA: Diagnosis present

## 2015-02-06 DIAGNOSIS — O34219 Maternal care for unspecified type scar from previous cesarean delivery: Secondary | ICD-10-CM | POA: Diagnosis not present

## 2015-02-06 HISTORY — DX: Premature rupture of membranes, unspecified as to length of time between rupture and onset of labor, unspecified weeks of gestation: O42.90

## 2015-02-06 LAB — CBC
HEMATOCRIT: 34.5 % — AB (ref 36.0–46.0)
Hemoglobin: 11.3 g/dL — ABNORMAL LOW (ref 12.0–15.0)
MCH: 28.6 pg (ref 26.0–34.0)
MCHC: 32.8 g/dL (ref 30.0–36.0)
MCV: 87.3 fL (ref 78.0–100.0)
Platelets: 147 10*3/uL — ABNORMAL LOW (ref 150–400)
RBC: 3.95 MIL/uL (ref 3.87–5.11)
RDW: 14.5 % (ref 11.5–15.5)
WBC: 9.4 10*3/uL (ref 4.0–10.5)

## 2015-02-06 LAB — TYPE AND SCREEN
ABO/RH(D): A POS
Antibody Screen: NEGATIVE

## 2015-02-06 LAB — POCT FERN TEST: POCT FERN TEST: POSITIVE

## 2015-02-06 MED ORDER — TERBUTALINE SULFATE 1 MG/ML IJ SOLN
0.2500 mg | Freq: Once | INTRAMUSCULAR | Status: DC | PRN
  Filled 2015-02-06: qty 1

## 2015-02-06 MED ORDER — BENZOCAINE-MENTHOL 20-0.5 % EX AERO
1.0000 "application " | INHALATION_SPRAY | CUTANEOUS | Status: DC | PRN
  Administered 2015-02-07: 1 via TOPICAL
  Filled 2015-02-06: qty 56

## 2015-02-06 MED ORDER — FENTANYL 2.5 MCG/ML BUPIVACAINE 1/10 % EPIDURAL INFUSION (WH - ANES)
14.0000 mL/h | INTRAMUSCULAR | Status: DC | PRN
  Administered 2015-02-06: 14 mL/h via EPIDURAL
  Filled 2015-02-06: qty 125

## 2015-02-06 MED ORDER — OXYTOCIN 40 UNITS IN LACTATED RINGERS INFUSION - SIMPLE MED: 62.5000 mL/h | INTRAVENOUS | Status: DC

## 2015-02-06 MED ORDER — TETANUS-DIPHTH-ACELL PERTUSSIS 5-2.5-18.5 LF-MCG/0.5 IM SUSP: 0.5000 mL | Freq: Once | INTRAMUSCULAR | Status: DC

## 2015-02-06 MED ORDER — OXYCODONE-ACETAMINOPHEN 5-325 MG PO TABS: 1.0000 | ORAL_TABLET | ORAL | Status: DC | PRN

## 2015-02-06 MED ORDER — LIDOCAINE HCL (PF) 1 % IJ SOLN
30.0000 mL | INTRAMUSCULAR | Status: DC | PRN
Start: 2015-02-06 — End: 2015-02-06
  Filled 2015-02-06: qty 30

## 2015-02-06 MED ORDER — LANOLIN HYDROUS EX OINT: TOPICAL_OINTMENT | CUTANEOUS | Status: DC | PRN

## 2015-02-06 MED ORDER — EPHEDRINE 5 MG/ML INJ
10.0000 mg | INTRAVENOUS | Status: DC | PRN
  Filled 2015-02-06: qty 2

## 2015-02-06 MED ORDER — LIDOCAINE HCL (PF) 1 % IJ SOLN
INTRAMUSCULAR | Status: DC | PRN
  Administered 2015-02-06 (×2): 4 mL via EPIDURAL
  Administered 2015-02-06: 2 mL via EPIDURAL

## 2015-02-06 MED ORDER — LACTATED RINGERS IV SOLN
INTRAVENOUS | Status: DC
  Administered 2015-02-06: 16:00:00 via INTRAVENOUS

## 2015-02-06 MED ORDER — ONDANSETRON HCL 4 MG/2ML IJ SOLN: 4.0000 mg | Freq: Four times a day (QID) | INTRAMUSCULAR | Status: DC | PRN

## 2015-02-06 MED ORDER — IBUPROFEN 600 MG PO TABS
600.0000 mg | ORAL_TABLET | Freq: Four times a day (QID) | ORAL | Status: DC
  Administered 2015-02-06 – 2015-02-07 (×5): 600 mg via ORAL
  Filled 2015-02-06 (×6): qty 1

## 2015-02-06 MED ORDER — DIPHENHYDRAMINE HCL 25 MG PO CAPS: 25.0000 mg | ORAL_CAPSULE | Freq: Four times a day (QID) | ORAL | Status: DC | PRN

## 2015-02-06 MED ORDER — PRENATAL MULTIVITAMIN CH
1.0000 | ORAL_TABLET | Freq: Every day | ORAL | Status: DC
  Administered 2015-02-07: 1 via ORAL
  Filled 2015-02-06: qty 1

## 2015-02-06 MED ORDER — ACETAMINOPHEN 325 MG PO TABS
650.0000 mg | ORAL_TABLET | ORAL | Status: DC | PRN
Start: 2015-02-06 — End: 2015-02-08

## 2015-02-06 MED ORDER — CITRIC ACID-SODIUM CITRATE 334-500 MG/5ML PO SOLN: 30.0000 mL | ORAL | Status: DC | PRN

## 2015-02-06 MED ORDER — ACETAMINOPHEN 325 MG PO TABS: 650.0000 mg | ORAL_TABLET | ORAL | Status: DC | PRN

## 2015-02-06 MED ORDER — ONDANSETRON HCL 4 MG/2ML IJ SOLN: 4.0000 mg | INTRAMUSCULAR | Status: DC | PRN

## 2015-02-06 MED ORDER — PHENYLEPHRINE 40 MCG/ML (10ML) SYRINGE FOR IV PUSH (FOR BLOOD PRESSURE SUPPORT)
80.0000 ug | PREFILLED_SYRINGE | INTRAVENOUS | Status: DC | PRN
  Filled 2015-02-06: qty 2
  Filled 2015-02-06: qty 20

## 2015-02-06 MED ORDER — OXYTOCIN 40 UNITS IN LACTATED RINGERS INFUSION - SIMPLE MED
1.0000 m[IU]/min | INTRAVENOUS | Status: DC
Start: 2015-02-06 — End: 2015-02-06
  Administered 2015-02-06: 2 m[IU]/min via INTRAVENOUS
  Filled 2015-02-06: qty 1000

## 2015-02-06 MED ORDER — FLEET ENEMA 7-19 GM/118ML RE ENEM: 1.0000 | ENEMA | RECTAL | Status: DC | PRN

## 2015-02-06 MED ORDER — ONDANSETRON HCL 4 MG PO TABS: 4.0000 mg | ORAL_TABLET | ORAL | Status: DC | PRN

## 2015-02-06 MED ORDER — OXYCODONE-ACETAMINOPHEN 5-325 MG PO TABS: 2.0000 | ORAL_TABLET | ORAL | Status: DC | PRN

## 2015-02-06 MED ORDER — OXYTOCIN 40 UNITS IN LACTATED RINGERS INFUSION - SIMPLE MED
1.0000 m[IU]/min | INTRAVENOUS | Status: DC
Start: 2015-02-06 — End: 2015-02-06

## 2015-02-06 MED ORDER — OXYTOCIN BOLUS FROM INFUSION: 500.0000 mL | INTRAVENOUS | Status: DC

## 2015-02-06 MED ORDER — DIPHENHYDRAMINE HCL 50 MG/ML IJ SOLN: 12.5000 mg | INTRAMUSCULAR | Status: DC | PRN

## 2015-02-06 MED ORDER — DIBUCAINE 1 % RE OINT: 1.0000 "application " | TOPICAL_OINTMENT | RECTAL | Status: DC | PRN

## 2015-02-06 MED ORDER — SIMETHICONE 80 MG PO CHEW: 80.0000 mg | CHEWABLE_TABLET | ORAL | Status: DC | PRN

## 2015-02-06 MED ORDER — SENNOSIDES-DOCUSATE SODIUM 8.6-50 MG PO TABS
2.0000 | ORAL_TABLET | ORAL | Status: DC
  Administered 2015-02-06 – 2015-02-07 (×2): 2 via ORAL
  Filled 2015-02-06 (×2): qty 2

## 2015-02-06 MED ORDER — LACTATED RINGERS IV SOLN: 500.0000 mL | INTRAVENOUS | Status: DC | PRN

## 2015-02-06 MED ORDER — WITCH HAZEL-GLYCERIN EX PADS: 1.0000 "application " | MEDICATED_PAD | CUTANEOUS | Status: DC | PRN

## 2015-02-06 MED ORDER — ZOLPIDEM TARTRATE 5 MG PO TABS: 5.0000 mg | ORAL_TABLET | Freq: Every evening | ORAL | Status: DC | PRN

## 2015-02-06 NOTE — MAU Note (Signed)
SROM at 2330. Occ ctxs. Clear fld

## 2015-02-06 NOTE — Anesthesia Procedure Notes (Signed)
Epidural Patient location during procedure: OB  Staffing Anesthesiologist: Janie Strothman Performed by: anesthesiologist   Preanesthetic Checklist Completed: patient identified, site marked, surgical consent, pre-op evaluation, timeout performed, IV checked, risks and benefits discussed and monitors and equipment checked  Epidural Patient position: sitting Prep: site prepped and draped and DuraPrep Patient monitoring: continuous pulse ox and blood pressure Approach: midline Location: L2-L3 Injection technique: LOR saline  Needle:  Needle type: Tuohy  Needle gauge: 17 G Needle length: 9 cm and 9 Needle insertion depth: 4 cm Catheter type: closed end flexible Catheter size: 19 Gauge Catheter at skin depth: 8 cm Test dose: negative  Assessment Events: blood not aspirated, injection not painful, no injection resistance, negative IV test and no paresthesia  Additional Notes Patient identified. Risks/Benefits/Options discussed with patient including but not limited to bleeding, infection, nerve damage, paralysis, failed block, incomplete pain control, headache, blood pressure changes, nausea, vomiting, reactions to medication both or allergic, itching and postpartum back pain. Confirmed with bedside nurse the patient's most recent platelet count. Confirmed with patient that they are not currently taking any anticoagulation, have any bleeding history or any family history of bleeding disorders. Patient expressed understanding and wished to proceed. All questions were answered. Sterile technique was used throughout the entire procedure. Please see nursing notes for vital signs. Test dose was given through epidural catheter and negative prior to continuing to dose epidural or start infusion. Warning signs of high block given to the patient including shortness of breath, tingling/numbness in hands, complete motor block, or any concerning symptoms with instructions to call for help. Patient was  given instructions on fall risk and not to get out of bed. All questions and concerns addressed with instructions to call with any issues or inadequate analgesia.     

## 2015-02-06 NOTE — H&P (Signed)
Mariah Marshall is a 34 y.o. female presenting with SROM since 11.30 pm. No UCs. No bleeding. Pt was in MAU since 5.30 am and was brought to L&D room at 11 am due to non-availability of rooms. She reports good FMs.  She is healthy [id number], at 38.6 wks who is s/p External cephalic version on 7/18.  ObHx- 1st was C/s for breech, then 2 successful VBACs and patient desires VBAC trial.  MedHx- negative, healthy female.  Uncomplicated pregnancy. PNCare- Dr Juliene Pina primary. Adequate pelvis, Homero Fellers breech s/p Version. Adequate AFI on 7/15,  posterior placenta, one LTCS scar on the uterus. GBS(-).   History OB History    Gravida Para Term Preterm AB TAB SAB Ectopic Multiple Living   Past Medical History  Diagnosis Date  . Miscarriage   . Medical history non-contributory   . Amniotic fluid leaking 02/06/2015   Past Surgical History  Procedure Laterality Date  . Cesarean section     Family History: family history is not on file. Social History:  reports that she has never smoked. She has never used smokeless tobacco. She reports that she does not drink alcohol or use illicit drugs.   Prenatal Transfer Tool  Maternal Diabetes: No Genetic Screening: Normal Maternal Ultrasounds/Referrals: Normal Fetal Ultrasounds or other Referrals:  None Maternal Substance Abuse:  No Significant Maternal Medications:  None Significant Maternal Lab Results:  Lab values include: Group B Strep negative Other Comments:  S/p External Cephalic version on 7/18  ROS neg   Exam Physical Exam  BP 93/52 mmHg  Pulse 75  Temp(Src) 97.8 F (36.6 C) (Oral)  Resp 18  Ht  (1.626 m)  Wt 155 lb (70.308 kg)  BMI 26.59 kg/m2  A&O x 3, no acute distress. Pleasant HEENT neg, no thyromegaly Lungs CTA bilat CV RRR, A1S2 normal Abdo soft, non tender, non acute Extr no edema/ tenderness  FHT  140/ + accels/ no decels/ moderate variab- category I Toco irregular  Dilation: 4, Effacement (%): 80,  Station: -2, Exam by:: k fields, rn  Prenatal labs: ABO, Rh: --/--/A POS (07/24 0800) Antibody: NEG (07/24 0800) Rubella: Immune (01/06 0000) RPR: Nonreactive (01/06 0000)  HBsAg: Negative (01/06 0000)  HIV: Non-reactive (01/06 0000)  GBS: Negative (07/07 0000)  Anatomy sono normal, posterior placenta.   Assessment/Plan: 34 yo female, at 38.6 wks, here with SROM x 12 hrs and not in active labor. Start pitocin carefully, VBAC candidate, risk of scar rupture and fetal/maternal distress reviewed, patient voiced understanding. EFW 7 lbs. Pelvis adequate. Epidural ok.    Lorita Forinash R 02/06/2015, 12:13 PM

## 2015-02-06 NOTE — Anesthesia Preprocedure Evaluation (Signed)
Anesthesia Evaluation  Patient identified by MRN, date of birth, ID band Patient awake    Reviewed: Allergy & Precautions, H&P , NPO status , Patient's Chart, lab work & pertinent test results  Airway Mallampati: II  TM Distance: >3 FB Neck ROM: full    Dental no notable dental hx.    Pulmonary neg pulmonary ROS,  breath sounds clear to auscultation  Pulmonary exam normal       Cardiovascular negative cardio ROS Normal cardiovascular examRhythm:regular Rate:Normal     Neuro/Psych negative neurological ROS  negative psych ROS   GI/Hepatic negative GI ROS, Neg liver ROS,   Endo/Other  negative endocrine ROS  Renal/GU negative Renal ROS  negative genitourinary   Musculoskeletal   Abdominal   Peds  Hematology negative hematology ROS (+)   Anesthesia Other Findings Pregnancy - uncomplicated Platelets and allergies reviewed Denies active cardiac or pulmonary symptoms, METS > 4  Denies blood thinning medications, bleeding disorders, hypertension, asthma, supine hypotension syndrome, previous anesthesia difficulties    Reproductive/Obstetrics (+) Pregnancy                             Anesthesia Physical Anesthesia Plan  ASA: II  Anesthesia Plan: Epidural   Post-op Pain Management:    Induction:   Airway Management Planned:   Additional Equipment:   Intra-op Plan:   Post-operative Plan:   Informed Consent: I have reviewed the patients History and Physical, chart, labs and discussed the procedure including the risks, benefits and alternatives for the proposed anesthesia with the patient or authorized representative who has indicated his/her understanding and acceptance.     Plan Discussed with: Anesthesiologist  Anesthesia Plan Comments:         Anesthesia Quick Evaluation

## 2015-02-07 ENCOUNTER — Encounter (HOSPITAL_COMMUNITY): Payer: Self-pay | Admitting: Obstetrics and Gynecology

## 2015-02-07 LAB — CBC
HEMATOCRIT: 34.5 % — AB (ref 36.0–46.0)
HEMOGLOBIN: 11.2 g/dL — AB (ref 12.0–15.0)
MCH: 28.2 pg (ref 26.0–34.0)
MCHC: 32.5 g/dL (ref 30.0–36.0)
MCV: 86.9 fL (ref 78.0–100.0)
Platelets: 146 10*3/uL — ABNORMAL LOW (ref 150–400)
RBC: 3.97 MIL/uL (ref 3.87–5.11)
RDW: 14.5 % (ref 11.5–15.5)
WBC: 12.4 10*3/uL — ABNORMAL HIGH (ref 4.0–10.5)

## 2015-02-07 LAB — RPR: RPR: NONREACTIVE

## 2015-02-07 NOTE — Progress Notes (Signed)
PPD 1 SVD/ VBAC x3  S:  Reports feeling well             Tolerating po/ No nausea or vomiting             Bleeding is light             Pain controlled with motrin             Up ad lib / ambulatory / voiding QS  Newborn breast feeding  O:               VS: BP 97/59 mmHg  Pulse 61  Temp(Src) 98.4 F (36.9 C) (Oral)  Resp 16  Ht  (1.626 m)  Wt 70.308 kg (155 lb)  BMI 26.59 kg/m2  SpO2 96%  Breastfeeding? Unknown   LABS:              Recent Labs  02/06/15 0800 02/07/15 0540  WBC 9.4 12.4*  HGB 11.3* 11.2*  PLT 147* 146*               Blood type: --/--/A POS (07/24 0800)  Rubella: Immune (01/06 0000)                  tdap current                I&O: Intake/Output      07/24 0701 - 07/25 0700 07/25 0701 - 07/26 0700   Urine (mL/kg/hr) 75 (0)    Blood 200 (0.1)    Total Output 275     Net -275                        Physical Exam:             Alert and oriented X3  Abdomen: soft, non-tender, non-distended              Fundus: firm, non-tender, U-2  Perineum: no edema  Lochia: light   Extremities: no edema, no calf pain or tenderness, few spider veins    A: PPD # 1 VBAC x 3   Doing well - stable status  P: Routine post partum orders  DC tomorrow  Marlinda Mike CNM, MSN, [facility] 02/07/2015, 8:08 AM

## 2015-02-07 NOTE — Lactation Note (Signed)
This note was copied from the chart of Mariah Marshall. Lactation Consultation Note Experienced BF mom who BF her other children for 13 months, but her 52 month old she BF for 12 months d/t her finding out she was expecting again. Mom states this baby is BF fine. Reminded to obtain a deep latch. Mom encouraged to feed baby w/feeding cues. Mom encouraged to waken baby for feeds. Reviewed Baby & Me book's Breastfeeding Basics. Mom encouraged to do skin-to-skin. WH/LC brochure given w/resources, support groups and LC services. Patient Name: Mariah Marshall [id number] Date: 02/07/2015 Reason for consult: Initial assessment   Maternal Data Has patient been taught Hand Expression?: Yes Does the patient have breastfeeding experience prior to this delivery?: Yes  Feeding    LATCH Score/Interventions       Type of Nipple: Everted at rest and after stimulation  Comfort (Breast/Nipple): Soft / non-tender     Hold (Positioning): No assistance needed to correctly position infant at breast.     Lactation Tools Discussed/Used     Consult Status Consult Status: PRN Date: 02/09/15 Follow-up type: In-patient    Charyl Dancer 02/07/2015, 5:45 AM

## 2015-02-07 NOTE — Anesthesia Postprocedure Evaluation (Signed)
  Anesthesia Post-op Note  Patient: Mariah Marshall  Procedure(s) Performed: * No procedures listed *  Patient Location: Mother/Baby  Anesthesia Type:Epidural  Level of Consciousness: awake, alert , oriented and patient cooperative  Airway and Oxygen Therapy: Patient Spontanous Breathing  Post-op Pain: mild  Post-op Assessment: Patient's Cardiovascular Status Stable, Respiratory Function Stable, No headache, No backache and Patient able to bend at knees              Post-op Vital Signs: stable  Last Vitals:  Filed Vitals:   02/07/15 0614  BP: 97/59  Pulse: 61  Temp: 36.9 C  Resp: 16    Complications: No apparent anesthesia complications

## 2015-02-07 NOTE — Lactation Note (Signed)
This note was copied from the chart of Mariah Marshall. Lactation Consultation Note  Patient Name: Mariah Marshall [id number] Date: 02/07/2015 Reason for consult: Follow-up assessment   With this mom and term baby, presently breast feeding well in cradle hold, mom denying  any questions/concerns. Mom is an experienced brest feeder, and knows to call as needed. .   Maternal Data    Feeding    LATCH Score/Interventions                      Lactation Tools Discussed/Used     Consult Status Consult Status: PRN Follow-up type: Call as needed    Alfred Levins 02/07/2015, 3:02 PM

## 2015-02-08 DIAGNOSIS — O34219 Maternal care for unspecified type scar from previous cesarean delivery: Secondary | ICD-10-CM | POA: Diagnosis not present

## 2015-02-08 MED ORDER — IBUPROFEN 600 MG PO TABS: 600.0000 mg | ORAL_TABLET | Freq: Four times a day (QID) | ORAL | Status: DC

## 2015-02-08 NOTE — Progress Notes (Signed)
PPD #2- SVD  Subjective:   Reports feeling well, ready for discharge Tolerating po/ No nausea or vomiting Bleeding is light Pain controlled with Motrin Up ad lib / ambulatory / voiding without problems Newborn: breastfeeding     Objective:   VS: VS:  Filed Vitals:   02/07/15 0614 02/07/15 1038 02/07/15 1845 02/08/15 0606  BP: 97/59 93/57 102/59 93/50  Pulse: 61 70 76 58  Temp: 98.4 F (36.9 C) 98.4 F (36.9 C) 98.1 F (36.7 C) 97.9 F (36.6 C)  TempSrc: Oral Oral Oral Oral  Resp: Height:      Weight:      SpO2:  99%      LABS:  Recent Labs  02/06/15 0800 02/07/15 0540  WBC 9.4 12.4*  HGB 11.3* 11.2*  PLT 147* 146*   Blood type: --/--/A POS (07/24 0800) Rubella: Immune (01/06 0000)                I&O: Intake/Output      07/25 0701 - 07/26 0700 07/26 0701 - 07/27 0700   Urine (mL/kg/hr)     Blood     Total Output       Net              Physical Exam: Alert and oriented X3 Abdomen: soft, non-tender, non-distended  Fundus: firm, non-tender, U-2 Perineum: Well approximated, no significant erythema, edema, or drainage; healing well. Lochia: small Extremities: no edema, no calf pain or tenderness    Assessment: PPD #2  [id number]/ S/P:spontaneous vaginal, VBAC, 1st degree laceration Doing well - stable for discharge home   Plan: Discharge home RX's:  Ibuprofen  po Q 6 hrs prn pain #30 Refill x 0 Routine pp visit in 6wks at [facility] booklet given    Donette Larry, N MSN, CNM 02/08/2015, 9:35 AM

## 2015-02-08 NOTE — Discharge Summary (Signed)
Obstetric Discharge Summary Reason for Admission: spontaneous abortion and [redacted] weeks gestation Prenatal Procedures: previous CS x1, previous VBAC x2, frank breech with successful ECV Intrapartum Procedures: spontaneous vaginal delivery and epidural Postpartum Procedures: none Complications-Operative and Postpartum: 1st degree perineal laceration HEMOGLOBIN  Date Value Ref Range Status  02/07/2015 11.2* 12.0 - 15.0 g/dL Final   HCT  Date Value Ref Range Status  02/07/2015 34.5* 36.0 - 46.0 % Final    Physical Exam:  General: alert, cooperative and no distress Lochia: appropriate Uterine Fundus: firm Incision: healing well, no significant drainage, no dehiscence, no significant erythema DVT Evaluation: No evidence of DVT seen on physical exam. Negative Homan's sign. No cords or calf tenderness. No significant calf/ankle edema.  Discharge Diagnoses: VBAC, 1st degree laceration  Discharge Information: Date: 02/08/2015 Activity: pelvic rest Diet: routine Medications: PNV and Ibuprofen Condition: stable Instructions: refer to practice specific booklet Discharge to: home Follow-up Information    Follow up with MODY,VAISHALI R, MD. Schedule an appointment as soon as possible for a visit in 6 weeks.   Specialty:  Obstetrics and Gynecology   Contact information:   [address] [place] [address] [phone]       Newborn Data: Live born female on 02/06/15 Birth Weight: 7 lb 5.3 oz (3325 g) APGAR: 8, 9  Home with mother.  Magali Bray, N 02/08/2015, 11:50 AM

## 2015-02-08 NOTE — Lactation Note (Signed)
This note was copied from the chart of Girl Zira Helinski. Lactation Consultation Note  Patient Name: Girl Kalayna Noy [id number] Date: 02/08/2015 Reason for consult: Follow-up assessment  With this mom and baby. Mom doing well, and denies any questions/concerns.    Maternal Data    Feeding Feeding Type: Breast Fed Length of feed: 35 min  LATCH Score/Interventions Latch: Grasps breast easily, tongue down, lips flanged, rhythmical sucking.  Audible Swallowing: A few with stimulation  Type of Nipple: Everted at rest and after stimulation  Comfort (Breast/Nipple): Soft / non-tender     Hold (Positioning): No assistance needed to correctly position infant at breast.  LATCH Score: 9  Lactation Tools Discussed/Used     Consult Status Consult Status: Complete    Alfred Levins 02/08/2015, 11:27 AM

## 2015-02-14 ENCOUNTER — Inpatient Hospital Stay (HOSPITAL_COMMUNITY)
Admission: AD | Admit: 2015-02-14 | Payer: BLUE CROSS/BLUE SHIELD | Source: Ambulatory Visit | Admitting: Obstetrics & Gynecology

## 2015-06-07 ENCOUNTER — Ambulatory Visit (INDEPENDENT_AMBULATORY_CARE_PROVIDER_SITE_OTHER): Payer: BLUE CROSS/BLUE SHIELD | Admitting: Physician Assistant

## 2015-06-07 VITALS — BP 122/74 | HR 69 | Temp 97.6°F | Resp 18 | Ht 64.0 in | Wt 137.0 lb

## 2015-06-07 DIAGNOSIS — R519 Headache, unspecified: Secondary | ICD-10-CM

## 2015-06-07 DIAGNOSIS — R51 Headache: Secondary | ICD-10-CM | POA: Diagnosis not present

## 2015-06-07 MED ORDER — AMOXICILLIN 875 MG PO TABS: 875.0000 mg | ORAL_TABLET | Freq: Two times a day (BID) | ORAL | Status: DC

## 2015-06-07 NOTE — Patient Instructions (Signed)
Take 3 Ibuprofen every 8 hours for the next 3-4 days.  If no improvement in symptoms please try the antibiotic.

## 2015-06-07 NOTE — Progress Notes (Signed)
06/07/2015 4:26 PM   DOB: 03/26/1981 / MRN: [id number]  SUBJECTIVE:  Mariah Marshall is a 34 y.o. female presenting for for the evaluation of headache and sinus pressure that started 14  days ago.  She denies runny nose, fever, cough, sore throat, difficulty breathing and jaw pain. Treatments tried thus far include 200 mg Ibuprofen with fair  relief. She denies sick contacts. She is currently breast feeding a four month old infant. These symptoms started off with a sore throat and nasal congestion, which have since resolved.   She has No Known Allergies.   She  has a past medical history of Miscarriage; Medical history non-contributory; Amniotic fluid leaking (02/06/2015); and Postpartum care following vaginal delivery (7/24) (02/07/2015).    She  reports that she has never smoked. She has never used smokeless tobacco. She reports that she does not drink alcohol or use illicit drugs. She  reports that she does not currently engage in sexual activity. The patient  has past surgical history that includes Cesarean section.  Her family history is not on file.  Review of Systems  Constitutional: Negative for fever, chills, malaise/fatigue and diaphoresis.  HENT: Positive for congestion. Negative for sore throat.   Respiratory: Negative for cough, hemoptysis, shortness of breath and wheezing.   Cardiovascular: Negative for chest pain.  Gastrointestinal: Negative for nausea.  Skin: Negative for rash.  Neurological: Negative for dizziness and weakness.  Endo/Heme/Allergies: Negative for polydipsia.    Problem list and medications reviewed and updated by myself where necessary, and exist elsewhere in the encounter.   OBJECTIVE:  BP 122/74 mmHg  Pulse 69  Temp(Src) 97.6 F (36.4 C) (Oral)  Resp 18  Ht 5\' 4"  (1.626 m)  Wt 137 lb (62.143 kg)  BMI 23.50 kg/m2  SpO2 99%  LMP 05/06/2014 (Approximate)  Physical Exam  Constitutional: She is oriented to person, place, and time. She appears  well-developed.  Eyes: EOM are normal. Pupils are equal, round, and reactive to light.  Cardiovascular: Normal rate.   Pulmonary/Chest: Effort normal.  Abdominal: She exhibits no distension.  Musculoskeletal: Normal range of motion.  Neurological: She is alert and oriented to person, place, and time. No cranial nerve deficit. Coordination normal.  Skin: Skin is warm and dry. She is not diaphoretic.  Psychiatric: She has a normal mood and affect.  Vitals reviewed.   No results found for this or any previous visit (from the past 48 hour(s)).  ASSESSMENT AND PLAN:  Victorino DikeJennifer was seen today for nasal congestion.  Diagnoses and all orders for this visit:  Pressure in head: This is most likely viral in nature, however the duration of her symptoms lend to the increased possibility of a frontal sinus or maxillary sinus infection with a bacterial etiology.  There is no fever.  She is leary of medication side effects given her breast feeding status.  Advised that I do not think the benefits of an antibiotic outweigh the risk and that we increase her dose of Ibuprofen to 600 mg po tid for the next few days to give the illness more time to resolve on its own. Advised that amox is safe in breast feeding, but could cause side effects to baby.   -     amoxicillin (AMOXIL) 875 MG tablet; Take 1 tablet (875 mg total) by mouth 2 (two) times daily. Please fill if your symptoms don't improve with conservative treatment.    The patient was advised to call or return to clinic if she does  not see an improvement in symptoms or to seek the care of the closest emergency department if she worsens with the above plan.   Mariah Marshall, MHS, PA-C [facility] 06/07/2015 4:26 PM

## 2018-03-14 ENCOUNTER — Other Ambulatory Visit: Payer: Self-pay

## 2018-03-14 ENCOUNTER — Ambulatory Visit: Payer: BLUE CROSS/BLUE SHIELD | Attending: Obstetrics & Gynecology | Admitting: Physical Therapy

## 2018-03-14 ENCOUNTER — Encounter: Payer: Self-pay | Admitting: Physical Therapy

## 2018-03-14 DIAGNOSIS — M6281 Muscle weakness (generalized): Secondary | ICD-10-CM | POA: Insufficient documentation

## 2018-03-14 DIAGNOSIS — R278 Other lack of coordination: Secondary | ICD-10-CM | POA: Insufficient documentation

## 2018-03-14 NOTE — Therapy (Signed)
[facility] [address], [address] [place], [place], [address] Phone: [phone]   Fax:  [phone]  Physical Therapy Evaluation  Patient Details  Name: Mariah Marshall MRN: [id number] Date of Birth: [birth year] Referring Provider: Dr. Shea Evans   Encounter Date: 03/14/2018  PT End of Session - 03/14/18 1021    Visit Number  1    Date for PT Re-Evaluation  05/09/18    Authorization Type  BCBS    PT Start Time  0845    PT Stop Time  0925    PT Time Calculation (min)  40 min    Activity Tolerance  Patient tolerated treatment well    Behavior During Therapy  [facility] for tasks assessed/performed       Past Medical History:  Diagnosis Date  . Amniotic fluid leaking 02/06/2015  . Medical history non-contributory   . Miscarriage   . Postpartum care following vaginal delivery (7/24) 02/07/2015    Past Surgical History:  Procedure Laterality Date  . CESAREAN SECTION     09/06/2008    There were no vitals filed for this visit.   Subjective Assessment - 03/14/18 0852    Subjective  Urinary leakage started several year ago. Sneeze or cough without bracing. Jumping on trampoline. Big concern is the diastasis recti. Patient is proactive.  Patient works out.     Patient Stated Goals  reduce leakage, work on diastasis    Currently in Pain?  No/denies    Multiple Pain Sites  No         [facility] PT Assessment - 03/14/18 0001      Assessment   Medical Diagnosis  stress urninary incontinence and rectus diastasis    Referring Provider  Dr. Shea Evans    Prior Therapy  none      Precautions   Precautions  None      Restrictions   Weight Bearing Restrictions  No      Balance Screen   Has the patient fallen in the past 6 months  No    Has the patient had a decrease in activity level because of a fear of falling?   No    Is the patient reluctant to leave their home because of a fear of falling?   No      Home  Public house manager residence      Prior Function   Level of Independence  Independent    Vocation  Full time employment    Vocation Requirements  none    Leisure  workout 4 days/week;  interval and Education administrator      Cognition   Overall Cognitive Status  Within Functional Limits for tasks assessed      Posture/Postural Control   Posture/Postural Control  No significant limitations      ROM / Strength   AROM / PROM / Strength  AROM;PROM;Strength      AROM   Lumbar - Right Side Bend  decreased by 25%    Lumbar - Left Side Bend  decreased by 25%      Palpation   Spinal mobility  decreased mobility of L1-L5    SI assessment   ASIS are equal    Palpation comment  tightness located right side of umblicus, tightness on the right suprapubic area.                 Objective measurements completed on examination: See above findings.  Pelvic Floor Special Questions - 03/14/18 0001    Prior Pregnancies  Yes    Number of Pregnancies  4    Number of C-Sections  1    Number of Vaginal Deliveries  3    Any difficulty with labor and deliveries  Yes   tearing that was stitched up   Diastasis Recti  1 finger width below and above the umbilicus    Currently Sexually Active  No    Urinary Leakage  Yes    Pad use  panty liner    Activities that cause leaking  Coughing;Sneezing    Fecal incontinence  No    Falling out feeling (prolapse)  No    Skin Integrity  Intact    Pelvic Floor Internal Exam  Patient comfirms identification and approves PT to assess the pelvic floor muscles and treatment    Exam Type  Vaginal    Palpation  tightness located on the sides of the introitus and right urethra sphincter    Strength  weak squeeze, no lift   strength increased after soft tissue work to the sides    Strength # of reps  5    Strength # of seconds  1    Tone  increased               PT Education - 03/14/18 [phone]    Education Details  pelvic  floor contraction and how to perform perineal massage    Person(s) Educated  Patient    Methods  Explanation;Demonstration;Verbal cues;Handout    Comprehension  Returned demonstration;Verbalized understanding       PT Short Term Goals - 03/14/18 1029      PT SHORT TERM GOAL #1   Title  independent with initial HEP including flexibiltiy    Time  4    Period  Weeks    Status  New    Target Date  04/11/18      PT SHORT TERM GOAL #2   Title  urinary leakage with coughing, laughing, and sneezing decreased >/= 25%    Time  4    Period  Weeks    Status  New    Target Date  04/11/18      PT SHORT TERM GOAL #3   Title  ability to contract the lower abdominals and approximate the diastasis recti     Time  4    Target Date  04/11/18        PT Long Term Goals - 03/14/18 1031      PT LONG TERM GOAL #1   Title  independent with HEP and understand how to progress HEP    Time  8    Period  Weeks    Status  New    Target Date  05/09/18      PT LONG TERM GOAL #2   Title  pelvic floor strength >/= 4/5 and urinary leakage decreased >/= 75% for coughing, sneezing, and laughing    Time  8    Period  Weeks    Status  New    Target Date  05/09/18      PT LONG TERM GOAL #3   Title  ability to contract her lower abdominals and pelvic floor with jumping to reduce urinary leakage >/= 75%    Time  8    Period  Weeks    Status  New    Target Date  05/09/18  Plan - 03/14/18 1024    Clinical Impression Statement  Patient is a 37 year old female with stress urinary incontinence and diastasis recti after having 4 children. She had 3 vaginal deliveries and 1 c-section.  Patient reports no pain.  Lumbar sidebending both ways is limited by 25%.  Diastasis REctil 1 finger width above and below the umbilicus. Trigger point in the right rectus abdominus by the umbilicus.  Tightness located on both sides of the introitus and right urethra sphincter. Pelvic floor strength was initialy  1/5 but after soft tissue work to the introitus it was 2/5.  Patient only able to hodl contract 1 second.  Patient will benefi tfrom skilled therapy to improve pelvic floor strength and tissue mobility while increasing the abdominal contraction without increasing the diastasis recti.     History and Personal Factors relevant to plan of care:  none    Clinical Presentation  Stable    Clinical Presentation due to:  stable condition    Clinical Decision Making  Low    Rehab Potential  Excellent    Clinical Impairments Affecting Rehab Potential  none    PT Frequency  1x / week    PT Duration  8 weeks    PT Treatment/Interventions  Biofeedback;Therapeutic activities;Therapeutic exercise;Patient/family education;Neuromuscular re-education;Manual techniques;Dry needling;Taping    PT Next Visit Plan  education on vaginal moisturizers and lubricants, soft tissue work to pelvic floor muscles and reduction of diastasis recti, how to engage the lower abdominals    Consulted and Agree with Plan of Care  Patient       Patient will benefit from skilled therapeutic intervention in order to improve the following deficits and impairments:  Increased fascial restricitons, Decreased coordination, Increased muscle spasms, Decreased activity tolerance, Decreased endurance, Decreased strength  Visit Diagnosis: Muscle weakness (generalized) - Plan: PT plan of care cert/re-cert  Other lack of coordination - Plan: PT plan of care cert/re-cert     Problem List There are no active problems to display for this patient.   Eulis Foster, PT 03/14/18 10:37 AM   [facility] [address] [place], [place], [address] Phone: [phone]   Fax:  [phone]  Name: Elzada Pytel MRN: [id number] Date of Birth: 1981-04-12

## 2018-03-14 NOTE — Patient Instructions (Addendum)
Quick Contraction: Gravity Eliminated (Hook-Lying)    Lie with hips and knees bent. Quickly squeeze then fully relax pelvic floor. Perform __1_ sets of __5_. Rest for _5__ seconds between sets. Do _1__ times a day.   Copyright  VHI. All rights reserved.  Sit ina reclined position and stroke the vaginal muscles like a C 20 time on both sides.   [facility] [address], [address] [place], [place] [address] Phone # [phone] Fax [phone]

## 2018-03-26 ENCOUNTER — Encounter: Payer: BLUE CROSS/BLUE SHIELD | Admitting: Physical Therapy

## 2018-04-04 ENCOUNTER — Ambulatory Visit: Payer: BLUE CROSS/BLUE SHIELD | Attending: Obstetrics & Gynecology | Admitting: Physical Therapy

## 2018-04-04 ENCOUNTER — Encounter: Payer: Self-pay | Admitting: Physical Therapy

## 2018-04-04 DIAGNOSIS — R278 Other lack of coordination: Secondary | ICD-10-CM | POA: Insufficient documentation

## 2018-04-04 DIAGNOSIS — M6281 Muscle weakness (generalized): Secondary | ICD-10-CM | POA: Insufficient documentation

## 2018-04-04 NOTE — Therapy (Signed)
[facility] [address], [address] [place], [place], [address] Phone: [phone]   Fax:  [phone]  Physical Therapy Treatment  Patient Details  Name: Mariah Marshall MRN: [id number] Date of Birth: May 21, 1981 Referring Provider: Dr. Shea EvansVaishali Mody   Encounter Date: 04/04/2018  PT End of Session - 04/04/18 0805    Visit Number  2    Date for PT Re-Evaluation  05/09/18    Authorization Type  BCBS    PT Start Time  0800    PT Stop Time  0840    PT Time Calculation (min)  40 min    Activity Tolerance  Patient tolerated treatment well    Behavior During Therapy  [facility] for tasks assessed/performed       Past Medical History:  Diagnosis Date  . Amniotic fluid leaking 02/06/2015  . Medical history non-contributory   . Miscarriage   . Postpartum care following vaginal delivery (7/24) 02/07/2015    Past Surgical History:  Procedure Laterality Date  . CESAREAN SECTION     09/06/2008    There were no vitals filed for this visit.  Subjective Assessment - 04/04/18 0805    Subjective  I am not sure I noticed changes.     Patient Stated Goals  reduce leakage, work on diastasis    Currently in Pain?  No/denies    Multiple Pain Sites  No                       [facility] Adult PT Treatment/Exercise - 04/04/18 0001      Neuro Re-ed    Neuro Re-ed Details   facilitating contraction of the lower abdominals with tactile cues, squeeze ball between knees, mirror and relaxing the lower abdominals, contrcting lower abdominals by sucing in the abdominals      Manual Therapy   Manual Therapy  Soft tissue mobilization;Myofascial release    Soft tissue mobilization  lumbar paraspinals and abdominals    Myofascial Release  quadruped myofascial release to the lateral trunk muscles and skin rolling to lower back             PT Education - 04/04/18 0849    Education Details  lower abdominal exercises; information on  vaginal moisturizers and lubricants    Person(s) Educated  Patient    Methods  Explanation;Demonstration;Verbal cues;Handout    Comprehension  Returned demonstration;Verbalized understanding       PT Short Term Goals - 04/04/18 1124      PT SHORT TERM GOAL #1   Title  independent with initial HEP including flexibiltiy    Time  4    Period  Weeks    Status  On-going      PT SHORT TERM GOAL #2   Title  urinary leakage with coughing, laughing, and sneezing decreased >/= 25%    Time  4    Period  Weeks    Status  On-going      PT SHORT TERM GOAL #3   Title  ability to contract the lower abdominals and approximate the diastasis recti     Time  4    Period  Weeks    Status  New        PT Long Term Goals - 03/14/18 1031      PT LONG TERM GOAL #1   Title  independent with HEP and understand how to progress HEP    Time  8    Period  Weeks  Status  New    Target Date  05/09/18      PT LONG TERM GOAL #2   Title  pelvic floor strength >/= 4/5 and urinary leakage decreased >/= 75% for coughing, sneezing, and laughing    Time  8    Period  Weeks    Status  New    Target Date  05/09/18      PT LONG TERM GOAL #3   Title  ability to contract her lower abdominals and pelvic floor with jumping to reduce urinary leakage >/= 75%    Time  8    Period  Weeks    Status  New    Target Date  05/09/18            Plan - 04/04/18 0851    Clinical Impression Statement  Patient is overcontracting the upper abdominals and having difficulty with contracting the lower abdominals.  Patient will protrude her lower abdominals with laughing and lifting her head in supine and standing.  Patient needs many tactile and verbal cues for isolation.  Patient will benefit from skilled therapy to improve pelvic flloor strength and tissue mobility while increasing the abdominal contraction without increasing the diastasis recti.     Rehab Potential  Excellent    Clinical Impairments Affecting Rehab  Potential  none    PT Frequency  1x / week    PT Duration  8 weeks    PT Treatment/Interventions  Biofeedback;Therapeutic activities;Therapeutic exercise;Patient/family education;Neuromuscular re-education;Manual techniques;Dry needling;Taping    PT Next Visit Plan  soft tissue work to pelvic floor muscles and reduction of diastasis recti, how to engage the lower abdominals; use large ball with pressing into it; flexibility exercises    Recommended Other Services  MD signed initial eval    Consulted and Agree with Plan of Care  Patient       Patient will benefit from skilled therapeutic intervention in order to improve the following deficits and impairments:  Increased fascial restricitons, Decreased coordination, Increased muscle spasms, Decreased activity tolerance, Decreased endurance, Decreased strength  Visit Diagnosis: Muscle weakness (generalized)  Other lack of coordination     Problem List There are no active problems to display for this patient.   Eulis Foster, PT 04/04/18 11:26 AM   [facility] [address], [address] [place], [place], [address] Phone: [phone]   Fax:  [phone]  Name: Mariah Marshall MRN: [id number] Date of Birth: Jan 14, 1981

## 2018-04-04 NOTE — Patient Instructions (Addendum)
Moisturizers . They are used in the vagina to hydrate the mucous membrane that make up the vaginal canal. . Designed to keep a more normal acid balance (ph) . Once placed in the vagina, it will last between two to three days.  . Use 2-3 times per week at bedtime and last longer than 60 min. . Ingredients to avoid is glycerin and fragrance, can increase chance of infection . Should not be used just before sex due to causing irritation . Most are gels administered either in a tampon-shaped applicator or as a vaginal suppository. They are non-hormonal.   Types of Moisturizers . Samul Dada- drug store . Vitamin E vaginal suppositories- Whole foods, Amazon . Moist Again . Coconut oil- can break down condoms . Julva- (Do no use if on Tamoxifen) amazon . Yes moisturizer- amazon . NeuEve Silk , NeuEve Silver for menopausal or over 65 (if have severe vaginal atrophy or cancer treatments use NeuEve Silk for  1 month than move to The Pepsi)- [employer], MapleFlower.dk . Olive and Bee intimate cream- www.oliveandbee.com.au . Mae vaginal moisturizer- Amazon  Creams to use externally on the Vulva area  Albertson's (good for for cancer patients that had radiation to the area)- [place] or Danaher Corporation.FlyingBasics.com.br  V-magic cream - amazon  Julva-amazon  Vital "V Wild Yam salve ( help moisturize and help with thinning vulvar area, does have Elberta by Damiva labial moisturizer ([place],    Things to avoid in the vaginal area . Do not use things to irritate the vulvar area . No lotions just specialized creams for the vulva area- Neogyn, V-magic, No soaps; can use Aveeno or Calendula cleanser if needed. Must be gentle . No deodorants . No douches . Good to sleep without underwear to let the vaginal area to air out . No scrubbing: spread the lips to let warm water rinse over labias and pat dry   Lubrication . Used  for intercourse to reduce friction . Avoid ones that have glycerin, warming gels, tingling gels, icing or cooling gel, scented . Avoid parabens due to a preservative similar to female sex hormone . May need to be reapplied once or several times during sexual activity . Can be applied to both partners genitals prior to vaginal penetration to minimize friction or irritation . Prevent irritation and mucosal tears that cause post coital pain and increased the risk of vaginal and urinary tract infections . Oil-based lubricants cannot be used with condoms due to breaking them down.  Least likely to irritate vaginal tissue.  . Plant based-lubes are safe . Silicone-based lubrication are thicker and last long and used for post-menopausal women  Vaginal Lubricators Here is a list of some suggested lubricators you can use for intercourse. Use the most hypoallergenic product.  You can place on you or your partner.   Slippery Stuff  Sylk or Sliquid Natural H2O ( good  if frequent UTI's)  Blossom Organics (www.blossom-organics.com)  Luvena   Coconut oil  PJur Woman Nude- water based lubricant, amazon  Uberlube- Amazon  Aloe Vera  Yes lubricant- Campbell Soup Platinum-Silicone, Target, Walgreens  Olive and Bee intimate cream-  www.oliveandbee.com.au Things to avoid in lubricants are glycerin, warming gels, tingling gels, icing or cooling  gels, and scented gels.  Also avoid Vaseline. [place] jelly, Replens, and Astroglide kills good bacteria(lactobacilli)  Things to avoid in the vaginal area . Do not use things to irritate the  vulvar area . No lotions- see below . No soaps; can use Aveeno or Calendula cleanser if needed. Must be gentle . No deodorants . No douches . Good to sleep without underwear to let the vaginal area to air out . No scrubbing: spread the lips to let warm water rinse over labias and pat dry  Creams that can be used on the Vulva Area  V CIT Groupmagic-amazon  Vital V Wild Yam  Salve  Julva- United States [employer] Corporationmazon  MoonMaid Botanical Pro-Meno Wild Yam Cream  Desert Havest Releveum or Desert Fluor CorporationHarvest Gele    Exercise Breath in while contracting the lower abdominal muscles while squeezing the ball for 5 seconds 5 times 3 times per day.    Diastasis Recti Exercises - Physical Therapy Diastasis Repair Exercises [phone] views .Published on Jun 01, 2013  [facility] [address], [address] [place], [place] [address] Phone # [phone] Fax [phone]

## 2018-04-14 ENCOUNTER — Encounter: Payer: Self-pay | Admitting: Physical Therapy

## 2018-04-14 ENCOUNTER — Ambulatory Visit: Payer: BLUE CROSS/BLUE SHIELD | Admitting: Physical Therapy

## 2018-04-14 DIAGNOSIS — M6281 Muscle weakness (generalized): Secondary | ICD-10-CM | POA: Diagnosis not present

## 2018-04-14 DIAGNOSIS — R278 Other lack of coordination: Secondary | ICD-10-CM

## 2018-04-14 NOTE — Patient Instructions (Addendum)
Abdominal Bracing With Pelvic Floor (Hook-Lying)    With neutral spine, tighten pelvic floor and abdominals. Hold 5 sec. Repeat _5__ times. Do _2 times a day. End off with 5 quick contractions.    Copyright  VHI. All rights reserved.   Isometric Hold With Pelvic Floor (Sitting)    Sit upright. Slowly inhale, and then exhale. Pull navel toward spine and tighten pelvic floor. Hold for _5__ seconds. Continue to breathe in and out during hold. Rest for _5__ seconds. Repeat _5__ times. Do __2_ times a day.  Copyright  VHI. All rights reserved.  [facility] [address], [address] [place], [place] [address] Phone # [phone] Fax [phone]

## 2018-04-14 NOTE — Therapy (Signed)
[facility] [address], [address] [place], [place], [address] Phone: [phone]   Fax:  [phone]  Physical Therapy Treatment  Patient Details  Name: Mariah Marshall MRN: [id number] Date of Birth: [birth year] Referring Provider (PT): Dr. Shea Evans   Encounter Date: 04/14/2018  PT End of Session - 04/14/18 1112    Visit Number  3    Date for PT Re-Evaluation  05/09/18    Authorization Type  BCBS    PT Start Time  1112   came late   PT Stop Time  1145    PT Time Calculation (min)  33 min    Activity Tolerance  Patient tolerated treatment well    Behavior During Therapy  [facility] for tasks assessed/performed       Past Medical History:  Diagnosis Date  . Amniotic fluid leaking 02/06/2015  . Medical history non-contributory   . Miscarriage   . Postpartum care following vaginal delivery (7/24) 02/07/2015    Past Surgical History:  Procedure Laterality Date  . CESAREAN SECTION     09/06/2008    There were no vitals filed for this visit.  Subjective Assessment - 04/14/18 1113    Subjective  I am learning the correct abdominals correctly. I have not noticed distinct changes with urinary leakage. I sneezed and lost some urine.     Patient Stated Goals  reduce leakage, work on diastasis    Currently in Pain?  No/denies    Multiple Pain Sites  No                    Pelvic Floor Special Questions - 04/14/18 0001    Pelvic Floor Internal Exam  Patient comfirms identification and approves PT to assess the pelvic floor muscles and treatment    Exam Type  Vaginal    Strength  fair squeeze, definite lift   after manual work       [facility] Adult PT Treatment/Exercise - 04/14/18 0001      Neuro Re-ed    Neuro Re-ed Details   facilitating pelvic floor contraction with tactile cues around the perineum      Manual Therapy   Manual Therapy  Internal Pelvic Floor    Internal Pelvic Floor  sides of introitus;  along the urethra spincter, and levator ani             PT Education - 04/14/18 1142    Education Details  lower abdominal contraction with pelvic floor contraction sitting and supine    Person(s) Educated  Patient    Methods  Explanation;Demonstration;Verbal cues;Handout    Comprehension  Verbalized understanding;Returned demonstration       PT Short Term Goals - 04/14/18 1146      PT SHORT TERM GOAL #1   Title  independent with initial HEP including flexibiltiy    Period  Weeks    Status  Achieved      PT SHORT TERM GOAL #2   Title  urinary leakage with coughing, laughing, and sneezing decreased >/= 25%    Time  4    Period  Weeks    Status  On-going      PT SHORT TERM GOAL #3   Title  ability to contract the lower abdominals and approximate the diastasis recti     Time  4    Period  Weeks    Status  On-going        PT Long Term Goals - 03/14/18 1031  PT LONG TERM GOAL #1   Title  independent with HEP and understand how to progress HEP    Time  8    Period  Weeks    Status  New    Target Date  05/09/18      PT LONG TERM GOAL #2   Title  pelvic floor strength >/= 4/5 and urinary leakage decreased >/= 75% for coughing, sneezing, and laughing    Time  8    Period  Weeks    Status  New    Target Date  05/09/18      PT LONG TERM GOAL #3   Title  ability to contract her lower abdominals and pelvic floor with jumping to reduce urinary leakage >/= 75%    Time  8    Period  Weeks    Status  New    Target Date  05/09/18            Plan - 04/14/18 1114    Clinical Impression Statement  Patient feels like she is able to contract the lower abdominals better.  Patient was able to contract the pelvic floor to 3/5 after manual work but needed tactile cues on the sides of the introitus for circular contraction.  Patient is now contracting the pelvic floor in sitting. Patient will benefit from skilled therapy to improve pelvic floor strength and tissue  mobility while increasing the abdominal contraction without increasing the diastasis recti.     Rehab Potential  Excellent    Clinical Impairments Affecting Rehab Potential  none    PT Frequency  1x / week    PT Duration  8 weeks    PT Treatment/Interventions  Biofeedback;Therapeutic activities;Therapeutic exercise;Patient/family education;Neuromuscular re-education;Manual techniques;Dry needling;Taping    PT Next Visit Plan  progress lower abdominal contraction with pelvic floor, work on circular contraction; squeeze with sneeze and cough    Consulted and Agree with Plan of Care  Patient       Patient will benefit from skilled therapeutic intervention in order to improve the following deficits and impairments:  Increased fascial restricitons, Decreased coordination, Increased muscle spasms, Decreased activity tolerance, Decreased endurance, Decreased strength  Visit Diagnosis: Muscle weakness (generalized)  Other lack of coordination     Problem List There are no active problems to display for this patient.   Eulis Foster, PT 04/14/18 11:47 AM   [facility] [address] [place], [place], [address] Phone: [phone]   Fax:  [phone]  Name: Mariah Marshall MRN: [id number] Date of Birth: 02-Jul-1981

## 2018-04-22 ENCOUNTER — Encounter: Payer: BLUE CROSS/BLUE SHIELD | Admitting: Physical Therapy

## 2018-05-05 ENCOUNTER — Ambulatory Visit: Payer: BLUE CROSS/BLUE SHIELD | Attending: Obstetrics & Gynecology | Admitting: Physical Therapy

## 2018-05-05 ENCOUNTER — Encounter: Payer: Self-pay | Admitting: Physical Therapy

## 2018-05-05 DIAGNOSIS — R278 Other lack of coordination: Secondary | ICD-10-CM | POA: Diagnosis present

## 2018-05-05 DIAGNOSIS — M6281 Muscle weakness (generalized): Secondary | ICD-10-CM | POA: Diagnosis present

## 2018-05-05 NOTE — Therapy (Addendum)
[facility] [address], [place], [place], [address] Phone: [phone]   Fax:  [phone]  Physical Therapy Treatment  Patient Details  Name: Mariah Marshall MRN: [id number] Date of Birth: Jul 02, 1981 Referring Provider (PT): Dr. Azucena Fallen   Encounter Date: 05/05/2018  PT End of Session - 05/05/18 0815    Visit Number  4    Date for PT Re-Evaluation  07/14/18    Authorization Type  [employer]    PT Start Time  0805   came late   PT Stop Time  0845    PT Time Calculation (min)  40 min    Activity Tolerance  Patient tolerated treatment well    Behavior During Therapy  [facility] for tasks assessed/performed       Past Medical History:  Diagnosis Date  . Amniotic fluid leaking 02/06/2015  . Medical history non-contributory   . Miscarriage   . Postpartum care following vaginal delivery (7/24) 02/07/2015    Past Surgical History:  Procedure Laterality Date  . CESAREAN SECTION     09/06/2008    There were no vitals filed for this visit.  Subjective Assessment - 05/05/18 0810    Subjective  2 times per year I have my back get flared up. This has helped 2 weeks ago.     Patient Stated Goals  reduce leakage, work on diastasis    Currently in Pain?  Yes    Pain Score  3     Pain Location  Back    Pain Orientation  Lower    Pain Descriptors / Indicators  Aching    Pain Type  Acute pain    Pain Onset  More than a month ago    Pain Frequency  Intermittent    Aggravating Factors   awkward position    Pain Relieving Factors  rest    Multiple Pain Sites  No         [facility] PT Assessment - 05/05/18 0001      Assessment   Medical Diagnosis  stress urninary incontinence and rectus diastasis    Referring Provider (PT)  Dr. Azucena Fallen    Prior Therapy  none      Precautions   Precautions  None      Restrictions   Weight Bearing Restrictions  No      Home Environment   Living Environment  Private residence       Prior Function   Level of Independence  Independent    Vocation  Full time employment    Vocation Requirements  none    Leisure  workout 4 days/week;  interval and circuit training      Cognition   Overall Cognitive Status  Within Functional Limits for tasks assessed      Posture/Postural Control   Posture/Postural Control  No significant limitations      AROM   Lumbar - Right Side Bend  decreased by 25%    Lumbar - Left Side Bend  decreased by 25%      Palpation   Spinal mobility  decreased mobility of L1-L5    SI assessment   ASIS are equal                   [facility] Adult PT Treatment/Exercise - 05/05/18 0001      Posture/Postural Control   Posture Comments  engaging core with reduction of lumbar lordosis; press foot into floor; lift chest  Exercises   Exercises  Lumbar      Lumbar Exercises: Stretches   Press Ups  5 reps;10 seconds    Quadruped Mid Back Stretch  3 reps   all three ways   Other Lumbar Stretch Exercise  thread the needle stretch; cat cow      Lumbar Exercises: Supine   Clam  10 reps;1 second   right, left with abdominal contraction   Bent Knee Raise  20 reps;1 second   core bracing     Lumbar Exercises: Quadruped   Opposite Arm/Leg Raise  Left arm/Right leg;Right arm/Left leg;1 second   engaging core            PT Education - 05/05/18 0838    Education Details  Access Code: [email or web address]     Person(s) Educated  Patient    Methods  Explanation;Demonstration;Verbal cues;Handout    Comprehension  Verbalized understanding;Returned demonstration       PT Short Term Goals - 05/05/18 3710      PT SHORT TERM GOAL #1   Title  independent with initial HEP including flexibiltiy    Time  4    Period  Weeks    Status  Achieved      PT SHORT TERM GOAL #2   Title  urinary leakage with coughing, laughing, and sneezing decreased >/= 25%    Time  4    Period  Weeks    Status  Achieved      PT SHORT TERM GOAL #3   Title   ability to contract the lower abdominals and approximate the diastasis recti     Time  4    Period  Weeks    Status  Achieved        PT Long Term Goals - 03/14/18 1031      PT LONG TERM GOAL #1   Title  independent with HEP and understand how to progress HEP    Time  8    Period  Weeks    Status  New    Target Date  05/09/18      PT LONG TERM GOAL #2   Title  pelvic floor strength >/= 4/5 and urinary leakage decreased >/= 75% for coughing, sneezing, and laughing    Time  8    Period  Weeks    Status  New    Target Date  05/09/18      PT LONG TERM GOAL #3   Title  ability to contract her lower abdominals and pelvic floor with jumping to reduce urinary leakage >/= 75%    Time  8    Period  Weeks    Status  New    Target Date  05/09/18            Plan - 05/05/18 0816    Clinical Impression Statement  Patient has not had urinary leakage since last visit.  Patient pelvic floor is 3/5.  Patient has limited lumbar ROM with sidebending. Patient is able to engage her lower abdominal in supine but not able to in standing due to weakness.  Patient has learned core strength in supine and quadruped. Patient will benefit from skilled therapy to improve pelvic floor strength and tissue mobility while increasing the abdominal contraction without increaseing the diastasis recti.     Rehab Potential  Excellent    Clinical Impairments Affecting Rehab Potential  none    PT Frequency  1x / week    PT Duration  8 weeks  PT Treatment/Interventions  Biofeedback;Therapeutic activities;Therapeutic exercise;Patient/family education;Neuromuscular re-education;Manual techniques;Dry needling;Taping    PT Next Visit Plan  progress lower abdominal contraction with pelvic floor, work on circular contraction; squeeze with sneeze and cough    PT Home Exercise Plan  Access Code: [email or web address]     Recommended Other Services  sent M D renewal    Consulted and Agree with Plan of Care  Patient       Patient  will benefit from skilled therapeutic intervention in order to improve the following deficits and impairments:  Increased fascial restricitons, Decreased coordination, Increased muscle spasms, Decreased activity tolerance, Decreased endurance, Decreased strength  Visit Diagnosis: Muscle weakness (generalized)  Other lack of coordination     Problem List There are no active problems to display for this patient.   Earlie Counts, PT 05/05/18 8:46 AM   [facility] [address] [place], [place], [address] Phone: [phone]   Fax:  [phone]  Name: Mariah Marshall MRN: [id number] Date of Birth: 09-21-1980  PHYSICAL THERAPY DISCHARGE SUMMARY  Visits from Start of Care: 4  Current functional level related to goals / functional outcomes: See above.    Remaining deficits: See above.    Education / Equipment: HEP  Plan: Patient agrees to discharge.  Patient goals were not met. Patient is being discharged due to not returning since the last visit. Thank you for the referral. Earlie Counts, PT 07/14/18 8:57 AM   ?????

## 2018-05-05 NOTE — Patient Instructions (Signed)
Access Code: [email or web address]  URL: [email or web address]  Date: 05/05/2018  Prepared by: Eulis Foster   Exercises  Cat Cow - 10 reps - 1 sets - 1x daily - 7x weekly  Child's Pose Stretch - 1 reps - 1 sets - 30 sec hold - 1x daily - 7x weekly  Child's Pose with Sidebending - 2 reps - 1 sets - 30 sec hold - 1x daily - 7x weekly  Child's Pose with Thread the Needle - 2 reps - 1 sets - 30 sec hold - 1x daily - 7x weekly  Prone Press Up - 2 reps - 1 sets - 10 sec hold - 1x daily - 7x weekly  Bent Knee Fallouts with Alternating Legs - 10 reps - 1 sets - 1x daily - 7x weekly  Supine March with Alternating Leg Lifts - 10 reps - 1 sets - 1x daily - 7x weekly  Bird Dog - 10 reps - 1 sets - 5 sec hold - 1x daily - 7x weekly  bradycardia

## 2018-05-30 ENCOUNTER — Ambulatory Visit: Payer: BLUE CROSS/BLUE SHIELD | Admitting: Physical Therapy

## 2022-03-08 ENCOUNTER — Other Ambulatory Visit: Payer: Self-pay | Admitting: Urology

## 2022-03-08 DIAGNOSIS — N369 Urethral disorder, unspecified: Secondary | ICD-10-CM

## 2022-03-29 ENCOUNTER — Ambulatory Visit
Admission: RE | Admit: 2022-03-29 | Discharge: 2022-03-29 | Disposition: A | Discharge status: Home / Self Care | Payer: No Typology Code available for payment source | Source: Ambulatory Visit | Attending: Urology | Admitting: Urology

## 2022-03-29 DIAGNOSIS — N369 Urethral disorder, unspecified: Secondary | ICD-10-CM

## 2022-03-29 MED ORDER — GADOBENATE DIMEGLUMINE 529 MG/ML IV SOLN
13.0000 mL | Freq: Once | INTRAVENOUS | Status: AC | PRN
  Administered 2022-03-29: 13 mL via INTRAVENOUS

## 2022-08-27 ENCOUNTER — Other Ambulatory Visit: Payer: Self-pay | Admitting: Urology

## 2022-09-10 ENCOUNTER — Encounter (HOSPITAL_BASED_OUTPATIENT_CLINIC_OR_DEPARTMENT_OTHER): Payer: Self-pay | Admitting: Urology

## 2022-09-10 NOTE — Progress Notes (Signed)
09/10/2022 2:06 PM Spoke w/ via phone for pre-op interview--patient- Lab needs dos--Urine pregnancy--               Lab results------none COVID test -----patient states asymptomatic no test needed Arrive at 1150------- NPO after MN NO Solid Food.  Clear liquids from MN until-0930-- Med rec completed Medications to take morning of surgery none---- Diabetic medication -N/A---- Patient instructed no nail polish to be worn day of surgery Patient instructed to bring photo id and insurance card day of surgery Patient aware to have Driver (ride ) / caregiver    for 24 hours after surgery (Spouse/Alan/[phone]) Patient Special Instructions N/A----- Pre-Op special Istructions --N/A--- Patient verbalized understanding of instructions that were given at this phone interview. Patient denies shortness of breath, chest pain, fever, cough at this phone interview.  Shera Laubach, Hulan Fray

## 2022-09-12 NOTE — H&P (Signed)
CC/HPI: cc: skenes gland cyst   03/07/2022: 42 year old woman who developed a bulge in vagina in July 2023 referred for [facility] gland cyst abutting urethral meatus. Patient has been taking a bath every few days. It is nontender but she is noticing it during sexual activity and certain exercises. This is never happened before.   04/11/2022: 42 year old woman here for follow-up after MRI of the pelvis. 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 Patient has no pain but does notice it during certain activities.   09/05/2022: 42 year old female who presents for preoperative appointment. 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 She is going to have this excised. She denies fevers, chills, gross hematuria. She is not currently having any pain. She denies chest pain and shortness of breath.     ALLERGIES: No Known Allergies    MEDICATIONS: Multivitamin  Wellbutrin Sr 150 mg tablet,sustained-release 12 hr     GU PSH: None   NON-GU PSH: Cesarean Delivery     GU PMH: Urethral Disorders Other - 04/11/2022, - 03/07/2022    NON-GU PMH: Anxiety    FAMILY HISTORY: 2 daughters - Runs in Family 2 sons - Runs in Family   SOCIAL HISTORY: Marital Status: Married Preferred Language: English; Ethnicity: Not Hispanic Or Latino; Race: White Current Smoking Status: Patient has never smoked.   Tobacco Use Assessment Completed: Used Tobacco in last 30 days? Does drink.  Drinks 3 caffeinated drinks per day.    REVIEW OF SYSTEMS:    GU Review Female:   Patient reports frequent urination. Patient denies stream starts and stops, have to strain to urinate, burning /pain with urination, being pregnant, get up at night to urinate, trouble starting your stream, and leakage of urine.  Gastrointestinal (Upper):   Patient denies nausea, vomiting, and indigestion/ heartburn.  Gastrointestinal (Lower):   Patient denies diarrhea and  constipation.  Constitutional:   Patient denies fever, night sweats, weight loss, and fatigue.  Skin:   Patient denies skin rash/ lesion and itching.  Eyes:   Patient denies blurred vision and double vision.  Ears/ Nose/ Throat:   Patient denies sore throat and sinus problems.  Hematologic/Lymphatic:   Patient denies swollen glands and easy bruising.  Cardiovascular:   Patient denies leg swelling and chest pains.  Respiratory:   Patient denies cough and shortness of breath.  Endocrine:   Patient denies excessive thirst.  Musculoskeletal:   Patient denies back pain and joint pain.  Neurological:   Patient denies headaches and dizziness.  Psychologic:   Patient denies depression and anxiety.   VITAL SIGNS:      09/05/2022 12:53 PM  BP 103/65 mmHg  Pulse 76 /min  Temperature 97.7 F / 36.5 C   GU PHYSICAL EXAMINATION:    Urethra: cyst    MULTI-SYSTEM PHYSICAL EXAMINATION:    Constitutional: Well-nourished. No physical deformities. Normally developed. Good grooming.  Neck: Neck symmetrical, not swollen. Normal tracheal position.  Respiratory: No labored breathing, no use of accessory muscles.   Skin: No paleness, no jaundice, no cyanosis. No lesion, no ulcer, no rash.  Neurologic / Psychiatric: Oriented to time, oriented to place, oriented to person. No depression, no anxiety, no agitation.  Eyes: Normal conjunctivae. Normal eyelids.  Ears, Nose, Mouth, and Throat: Left ear no scars, no lesions, no masses. Right ear no scars, no lesions, no masses. Nose no scars, no lesions, no masses. Normal hearing. Normal lips.  Musculoskeletal: Normal gait  and station of head and neck.     Complexity of Data:  Source Of History:  Patient  Records Review:   Previous Doctor Records, Previous Patient Records  Urine Test Review:   Urinalysis   09/05/22  Urinalysis  Urine Appearance Clear   Urine Color Straw   Urine Glucose Neg mg/dL  Urine Bilirubin Neg mg/dL  Urine Ketones Neg mg/dL  Urine  Specific Gravity <=1.005   Urine Blood 3+ ery/uL  Urine pH 5.5   Urine Protein Neg mg/dL  Urine Urobilinogen 0.2 mg/dL  Urine Nitrites Neg   Urine Leukocyte Esterase Neg leu/uL  Urine WBC/hpf NS (Not Seen)   Urine RBC/hpf 3 - 10/hpf   Urine Epithelial Cells 0 - 5/hpf   Urine Bacteria NS (Not Seen)   Urine Mucous Present   Urine Yeast NS (Not Seen)   Urine Trichomonas Not Present   Urine Cystals NS (Not Seen)   Urine Casts NS (Not Seen)   Urine Sperm Not Present    PROCEDURES:          Urinalysis w/Scope Dipstick Dipstick Cont'd Micro  Color: Straw Bilirubin: Neg mg/dL WBC/hpf: NS (Not Seen)  Appearance: Clear Ketones: Neg mg/dL RBC/hpf: 3 - 10/hpf  Specific Gravity: <=1.005 Blood: 3+ ery/uL Bacteria: NS (Not Seen)  pH: 5.5 Protein: Neg mg/dL Cystals: NS (Not Seen)  Glucose: Neg mg/dL Urobilinogen: 0.2 mg/dL Casts: NS (Not Seen)    Nitrites: Neg Trichomonas: Not Present    Leukocyte Esterase: Neg leu/uL Mucous: Present      Epithelial Cells: 0 - 5/hpf      Yeast: NS (Not Seen)      Sperm: Not Present    ASSESSMENT:      ICD-10 Details  1 GU:   Urethral Disorders Other - [id number] Acute, Uncomplicated   PLAN:           Document Letter(s):  Created for Patient: Clinical Summary         Notes:   Urine is without infectious parameters today. She will keep her upcoming surgery as scheduled for [facility] gland cyst excision. She will notify our office should anything change.

## 2022-09-17 NOTE — Anesthesia Preprocedure Evaluation (Signed)
Anesthesia Evaluation  Patient identified by MRN, date of birth, ID band Patient awake    Reviewed: Allergy & Precautions, H&P , NPO status , Patient's Chart, lab work & pertinent test results  Airway Mallampati: I  TM Distance: >3 FB Neck ROM: Full    Dental no notable dental hx. (+) Teeth Intact, Dental Advisory Given   Pulmonary neg pulmonary ROS   Pulmonary exam normal breath sounds clear to auscultation       Cardiovascular negative cardio ROS Normal cardiovascular exam Rhythm:Regular Rate:Normal     Neuro/Psych negative neurological ROS  negative psych ROS   GI/Hepatic negative GI ROS, Neg liver ROS,,,  Endo/Other  negative endocrine ROS    Renal/GU negative Renal ROS  negative genitourinary   Musculoskeletal negative musculoskeletal ROS (+)    Abdominal Normal abdominal exam  (+)   Peds negative pediatric ROS (+)  Hematology negative hematology ROS (+)   Anesthesia Other Findings   Reproductive/Obstetrics negative OB ROS                             Anesthesia Physical Anesthesia Plan  ASA: 1  Anesthesia Plan: General   Post-op Pain Management: Tylenol PO (pre-op)*, Precedex and Toradol IV (intra-op)*   Induction: Intravenous  PONV Risk Score and Plan: 3 and Treatment may vary due to age or medical condition, Midazolam and Ondansetron  Airway Management Planned: LMA  Additional Equipment: None  Intra-op Plan:   Post-operative Plan: Extubation in OR  Informed Consent: I have reviewed the patients History and Physical, chart, labs and discussed the procedure including the risks, benefits and alternatives for the proposed anesthesia with the patient or authorized representative who has indicated his/her understanding and acceptance.     Dental advisory given  Plan Discussed with:   Anesthesia Plan Comments:        Anesthesia Quick Evaluation

## 2022-09-18 ENCOUNTER — Ambulatory Visit (HOSPITAL_BASED_OUTPATIENT_CLINIC_OR_DEPARTMENT_OTHER): Payer: No Typology Code available for payment source | Admitting: Anesthesiology

## 2022-09-18 ENCOUNTER — Encounter (HOSPITAL_BASED_OUTPATIENT_CLINIC_OR_DEPARTMENT_OTHER)
Admission: RE | Disposition: A | Discharge status: Home / Self Care | Payer: Self-pay | Source: Home / Self Care | Attending: Urology

## 2022-09-18 ENCOUNTER — Other Ambulatory Visit: Payer: Self-pay

## 2022-09-18 ENCOUNTER — Ambulatory Visit (HOSPITAL_BASED_OUTPATIENT_CLINIC_OR_DEPARTMENT_OTHER)
Admission: RE | Admit: 2022-09-18 | Discharge: 2022-09-18 | Disposition: A | Discharge status: Home / Self Care | Payer: No Typology Code available for payment source | Attending: Urology | Admitting: Urology

## 2022-09-18 ENCOUNTER — Encounter (HOSPITAL_BASED_OUTPATIENT_CLINIC_OR_DEPARTMENT_OTHER): Payer: Self-pay | Admitting: Urology

## 2022-09-18 DIAGNOSIS — N898 Other specified noninflammatory disorders of vagina: Secondary | ICD-10-CM

## 2022-09-18 DIAGNOSIS — N368 Other specified disorders of urethra: Secondary | ICD-10-CM | POA: Insufficient documentation

## 2022-09-18 HISTORY — DX: Anxiety disorder, unspecified: F41.9

## 2022-09-18 HISTORY — PX: EXCISION VAGINAL CYST: SHX5825

## 2022-09-18 LAB — POCT PREGNANCY, URINE: Preg Test, Ur: NEGATIVE

## 2022-09-18 SURGERY — EXCISION, CYST, VAGINA
Anesthesia: General

## 2022-09-18 MED ORDER — EPHEDRINE SULFATE (PRESSORS) 50 MG/ML IJ SOLN
INTRAMUSCULAR | Status: DC | PRN
  Administered 2022-09-18: 10 mg via INTRAVENOUS
  Administered 2022-09-18: 5 mg via INTRAVENOUS

## 2022-09-18 MED ORDER — LIDOCAINE HCL (PF) 2 % IJ SOLN
INTRAMUSCULAR | Status: AC
  Filled 2022-09-18: qty 10

## 2022-09-18 MED ORDER — KETOROLAC TROMETHAMINE 30 MG/ML IJ SOLN
INTRAMUSCULAR | Status: DC | PRN
  Administered 2022-09-18: 30 mg via INTRAVENOUS

## 2022-09-18 MED ORDER — EPHEDRINE 5 MG/ML INJ
INTRAVENOUS | Status: AC
  Filled 2022-09-18: qty 5

## 2022-09-18 MED ORDER — HYDROMORPHONE HCL 1 MG/ML IJ SOLN: 0.2500 mg | INTRAMUSCULAR | Status: DC | PRN

## 2022-09-18 MED ORDER — MIDAZOLAM HCL 2 MG/2ML IJ SOLN
INTRAMUSCULAR | Status: DC | PRN
  Administered 2022-09-18: 2 mg via INTRAVENOUS

## 2022-09-18 MED ORDER — ACETAMINOPHEN 500 MG PO TABS
1000.0000 mg | ORAL_TABLET | Freq: Once | ORAL | Status: AC
  Administered 2022-09-18: 1000 mg via ORAL

## 2022-09-18 MED ORDER — ONDANSETRON HCL 4 MG/2ML IJ SOLN
INTRAMUSCULAR | Status: DC | PRN
  Administered 2022-09-18: 4 mg via INTRAVENOUS

## 2022-09-18 MED ORDER — DEXMEDETOMIDINE HCL IN NACL 80 MCG/20ML IV SOLN
INTRAVENOUS | Status: DC | PRN
  Administered 2022-09-18: 4 ug via BUCCAL

## 2022-09-18 MED ORDER — PROPOFOL 10 MG/ML IV BOLUS
INTRAVENOUS | Status: DC | PRN
  Administered 2022-09-18: 150 mg via INTRAVENOUS

## 2022-09-18 MED ORDER — LIDOCAINE HCL (CARDIAC) PF 100 MG/5ML IV SOSY
PREFILLED_SYRINGE | INTRAVENOUS | Status: DC | PRN
  Administered 2022-09-18: 80 mg via INTRAVENOUS

## 2022-09-18 MED ORDER — FENTANYL CITRATE (PF) 100 MCG/2ML IJ SOLN
INTRAMUSCULAR | Status: DC | PRN
  Administered 2022-09-18 (×4): 25 ug via INTRAVENOUS

## 2022-09-18 MED ORDER — OXYCODONE HCL 5 MG/5ML PO SOLN: 5.0000 mg | Freq: Once | ORAL | Status: DC | PRN

## 2022-09-18 MED ORDER — ACETAMINOPHEN 500 MG PO TABS
ORAL_TABLET | ORAL | Status: AC
  Filled 2022-09-18: qty 2

## 2022-09-18 MED ORDER — KETOROLAC TROMETHAMINE 30 MG/ML IJ SOLN: 30.0000 mg | Freq: Once | INTRAMUSCULAR | Status: DC | PRN

## 2022-09-18 MED ORDER — TRAMADOL HCL 50 MG PO TABS: 50.0000 mg | ORAL_TABLET | Freq: Four times a day (QID) | ORAL | 0 refills | Status: AC | PRN

## 2022-09-18 MED ORDER — LIDOCAINE-EPINEPHRINE (PF) 1 %-1:200000 IJ SOLN
INTRAMUSCULAR | Status: DC | PRN
  Administered 2022-09-18: 5 mL

## 2022-09-18 MED ORDER — FENTANYL CITRATE (PF) 100 MCG/2ML IJ SOLN
INTRAMUSCULAR | Status: AC
  Filled 2022-09-18: qty 2

## 2022-09-18 MED ORDER — ONDANSETRON HCL 4 MG/2ML IJ SOLN: 4.0000 mg | Freq: Once | INTRAMUSCULAR | Status: DC | PRN

## 2022-09-18 MED ORDER — DEXAMETHASONE SODIUM PHOSPHATE 4 MG/ML IJ SOLN
INTRAMUSCULAR | Status: DC | PRN
  Administered 2022-09-18: 5 mg via INTRAVENOUS

## 2022-09-18 MED ORDER — PHENYLEPHRINE 80 MCG/ML (10ML) SYRINGE FOR IV PUSH (FOR BLOOD PRESSURE SUPPORT)
PREFILLED_SYRINGE | INTRAVENOUS | Status: AC
  Filled 2022-09-18: qty 10

## 2022-09-18 MED ORDER — CEFAZOLIN SODIUM-DEXTROSE 2-4 GM/100ML-% IV SOLN
INTRAVENOUS | Status: AC
  Filled 2022-09-18: qty 100

## 2022-09-18 MED ORDER — CEFAZOLIN SODIUM-DEXTROSE 2-4 GM/100ML-% IV SOLN
2.0000 g | INTRAVENOUS | Status: AC
  Administered 2022-09-18: 2 g via INTRAVENOUS

## 2022-09-18 MED ORDER — STERILE WATER FOR IRRIGATION IR SOLN
Status: DC | PRN
  Administered 2022-09-18: 500 mL

## 2022-09-18 MED ORDER — BACITRACIN ZINC 500 UNIT/GM EX OINT
TOPICAL_OINTMENT | CUTANEOUS | Status: DC | PRN
  Administered 2022-09-18: 1 via TOPICAL

## 2022-09-18 MED ORDER — OXYCODONE HCL 5 MG PO TABS: 5.0000 mg | ORAL_TABLET | Freq: Once | ORAL | Status: DC | PRN

## 2022-09-18 MED ORDER — MIDAZOLAM HCL 2 MG/2ML IJ SOLN
INTRAMUSCULAR | Status: AC
  Filled 2022-09-18: qty 2

## 2022-09-18 MED ORDER — LACTATED RINGERS IV SOLN: INTRAVENOUS | Status: DC

## 2022-09-18 SURGICAL SUPPLY — 28 items
BAG DRAIN URO-CYSTO SKYTR STRL (DRAIN) ×1 IMPLANT
BAG DRN UROCATH (DRAIN)
BAG URINE LEG 500ML (DRAIN) IMPLANT
BLADE CLIPPER SENSICLIP SURGIC (BLADE) IMPLANT
BLADE SURG 15 STRL LF DISP TIS (BLADE) ×1 IMPLANT
BLADE SURG 15 STRL SS (BLADE) ×1
COVER MAYO STAND STRL (DRAPES) ×1 IMPLANT
GAUZE 4X4 16PLY ~~LOC~~+RFID DBL (SPONGE) IMPLANT
GLOVE BIO SURGEON STRL SZ 6.5 (GLOVE) ×1 IMPLANT
GOWN STRL REUS W/TWL LRG LVL3 (GOWN DISPOSABLE) ×1 IMPLANT
KIT TURNOVER CYSTO (KITS) ×1 IMPLANT
MANIFOLD NEPTUNE II (INSTRUMENTS) IMPLANT
NDL HYPO 25X1 1.5 SAFETY (NEEDLE) ×1 IMPLANT
NEEDLE HYPO 25X1 1.5 SAFETY (NEEDLE) ×1 IMPLANT
NS IRRIG 500ML POUR BTL (IV SOLUTION) IMPLANT
PACK VAGINAL WOMENS (CUSTOM PROCEDURE TRAY) ×1 IMPLANT
PACKING VAGINAL (PACKING) IMPLANT
PLUG CATH AND CAP STER (CATHETERS) IMPLANT
SET IRRIG Y TYPE TUR BLADDER L (SET/KITS/TRAYS/PACK) IMPLANT
SLEEVE SCD COMPRESS KNEE MED (STOCKING) ×1 IMPLANT
SUCTION FRAZIER HANDLE 10FR (MISCELLANEOUS) ×1
SUCTION TUBE FRAZIER 10FR DISP (MISCELLANEOUS) IMPLANT
SUT VIC AB 2-0 UR5 27 (SUTURE) ×1 IMPLANT
SUT VIC AB 3-0 SH 27 (SUTURE) ×1
SUT VIC AB 3-0 SH 27X BRD (SUTURE) IMPLANT
SYR BULB IRRIG 60ML STRL (SYRINGE) ×1 IMPLANT
TRAY FOLEY W/BAG SLVR 14FR LF (SET/KITS/TRAYS/PACK) IMPLANT
WATER STERILE IRR 500ML POUR (IV SOLUTION) IMPLANT

## 2022-09-18 NOTE — Anesthesia Postprocedure Evaluation (Signed)
Anesthesia Post Note  Patient: Mariah Marshall  Procedure(s) Performed: EXCISION  ANTERIOR VAGINAL WALL CYST     Patient location during evaluation: PACU Anesthesia Type: General Level of consciousness: awake and alert Pain management: pain level controlled Vital Signs Assessment: post-procedure vital signs reviewed and stable Respiratory status: spontaneous breathing, nonlabored ventilation, respiratory function stable and patient connected to nasal cannula oxygen Cardiovascular status: blood pressure returned to baseline and stable Postop Assessment: no apparent nausea or vomiting Anesthetic complications: no  No notable events documented.  Last Vitals:  Vitals:   09/18/22 1330 09/18/22 1431  BP: (!) 98/57 (!) 101/46  Pulse:    Resp:  14  Temp: 36.7 C   SpO2:  98%    Last Pain:  Vitals:   09/18/22 1431  TempSrc: Oral  PainSc:                  Barnet Glasgow

## 2022-09-18 NOTE — Anesthesia Procedure Notes (Signed)
Procedure Name: LMA Insertion Date/Time: 09/18/2022 2:04 PM  Performed by: Justice Rocher, CRNAPre-anesthesia Checklist: Patient identified, Emergency Drugs available, Suction available, Patient being monitored and Timeout performed Patient Re-evaluated:Patient Re-evaluated prior to induction Oxygen Delivery Method: Circle system utilized Preoxygenation: Pre-oxygenation with 100% oxygen Induction Type: IV induction Ventilation: Mask ventilation without difficulty LMA: LMA inserted LMA Size: 4.0 Number of attempts: 1 Airway Equipment and Method: Bite block Placement Confirmation: positive ETCO2, breath sounds checked- equal and bilateral and CO2 detector Tube secured with: Tape Dental Injury: Teeth and Oropharynx as per pre-operative assessment

## 2022-09-18 NOTE — Op Note (Signed)
Operative Note  Preoperative diagnosis:  1.  Skene's gland cyst  Postoperative diagnosis: 1.  Skene's gland cyst  Procedure(s): 1.  Excision of skene's gland cyst  Surgeon: Jacalyn Lefevre, MD  Assistants:  None  Anesthesia:  General  Complications:  None  EBL:  10cc  Specimens: 1. Vaginal wall cyst  Drains/Catheters: 1.  none  Intraoperative findings:   1 cm anterior vaginal wall cyst abutting urethral meatus slightly left of midline  Indication:  Mariah Marshall is a 42 y.o. female with a vaginal wall cyst abutting urethral meatus consistent with skene's gland cyst.   Description of procedure:  After risk and benefits of the procedure discussed the patient informed consent was obtained.  The patient was taken to the operating room placed in the supine position.  Anesthesia was induced and antibiotics were administered.  The patient was then repositioned in dorsolithotomy position.  She was prepped and draped in the usual sterile fashion and a timeout performed.  A 14 French urethral catheter was then placed in sterile condition on the field.  A weighted speculum was placed in the vaginal vault.  The cyst was easily identified and noted to be approximately 1 cm in diameter slightly left of midline just proximal to the urethral meatus.  1% lidocaine with epinephrine was then used to anesthetize the area.  The mucosa was sharply opened over the cyst and dissection was carried out sharply with curved Metzenbaums.  At this time the cyst was ruptured and purulent drainage noted.  Dissection continued sharply until the entire sac was excised.  Care was taken to avoid the urethra and bladder.  Examination of the remaining vaginal mucosa showed no other abnormalities.  The area was irrigated and then the vaginal mucosa was closed with running 3-0 Vicryl suture.  The Foley catheter was removed.  The patient emerged from anesthesia and sent to the PACU in stable condition.  Plan:   discharge home

## 2022-09-18 NOTE — Transfer of Care (Signed)
Immediate Anesthesia Transfer of Care Note  Patient: Mariah Marshall  Procedure(s) Performed: EXCISION  ANTERIOR VAGINAL WALL CYST  Patient Location: PACU  Anesthesia Type:General  Level of Consciousness: awake and patient cooperative  Airway & Oxygen Therapy: Patient Spontanous Breathing and Patient connected to nasal cannula oxygen  Post-op Assessment: Report given to RN and Post -op Vital signs reviewed and stable  Post vital signs: Reviewed and stable  Last Vitals:  Vitals Value Taken Time  BP    Temp    Pulse 65 09/18/22 1308  Resp 13 09/18/22 1308  SpO2 98 % 09/18/22 1308  Vitals shown include unvalidated device data.  Last Pain:  Vitals:   09/18/22 1117  TempSrc: Oral  PainSc: 0-No pain      Patients Stated Pain Goal: 7 ([id number])  Complications: No notable events documented.

## 2022-09-18 NOTE — Interval H&P Note (Signed)
History and Physical Interval Note: We discussed possible need for postoperative catheter as well as recurrence of cyst and damage to surrounding structures. Reexamined patient and confirmed cyst abutting urethral meatus.   09/18/2022 11:48 AM  Mariah Marshall  has presented today for surgery, with the diagnosis of ANTERIOR VAGINAL CYST.  The various methods of treatment have been discussed with the patient and family. After consideration of risks, benefits and other options for treatment, the patient has consented to  Procedure(s) with comments: EXCISION  ANTERIOR VAGINAL WALL CYST (N/A) - 75 MINS as a surgical intervention.  The patient's history has been reviewed, patient examined, no change in status, stable for surgery.  I have reviewed the patient's chart and labs.  Questions were answered to the patient's satisfaction.     Tangie Stay D Ameshia Pewitt

## 2022-09-18 NOTE — Discharge Instructions (Addendum)
Post-op Instructions following Vaginal surgery to remove cyst   Diet:  You may return to your normal diet within 24 hours following your surgery. You may note some mild nausea and possibly vomiting the first 6-8 hours following surgery. This is usually due to the side effects of anesthesia, and will disappear quite soon. I would suggest clear liquids and a very light meal the first evening following your surgery.  Activity:  Your physical activity is to be restricted, especially during the first 2 weeks home. During this time use the following guidelines:  No lifting heavy objects (anything greater than 10 pounds). No driving a car and limit long car rides. No strenuous exercise, limits stairclimbing to a minimum. Do not swim or soak in a bath tub for 2 weeks. Do not place anything per vagina for 4 weeks.  Wound care: There is very little wound care.  The suprapubic incisions (above your pubic bone) are glued closed and this will peel off over the next 7-14 days.  They do not need to be covered.   The vaginal incision may ooze/bleed a little bit the first couple days after surgery.  This is normal.  It is okay to use a sanitary pad to help keep things clean.  Otherwise this incision needs no additional care.  Bowels:  You may need a stool softener and. A bowel movement every other day is reasonable. Use a mild laxative if needed, such as milk of magnesia 2-3 tablespoons, or 2 Dulcolax tablets. Call if you continue to have problems. If you had been taking narcotics for pain, before, during or after your surgery, you may be constipated. Take a laxative if necessary.  Medication:  You should resume your pre-surgery medications unless told not to. In addition you may be given an antibiotic to prevent or treat infection. Antibiotics are not always necessary. Pain pills (Tramadol) may also be given to help with the incision and catheter discomfort. Tylenol (acetaminophen) or Advil (ibuprofen)  which have no narcotics are better if the pain is not too bad. All medication should be taken as prescribed until the bottles are finished unless you are having an unusual reaction to one of the drugs.  Tramadol is for moderate to severe pain AZO is over the counter and can be used for burning with urination Tylenol and ibuprofen can also be used for mild to moderate pain.  Problems you should report to [place]:  a. Fever greater than 101F. b. Heavy bleeding, or clots (see notes above about blood in urine). c. Inability to urinate. d. Drug reactions (hives, rash, nausea, vomiting, diarrhea). e. Severe burning or pain with urination that is not improving.    No acetaminophen/Tylenol until after 5:20pm today if needed for pain  No ibuprofen, Advil, Aleve, Motrin, ketorolac, meloxicam, naproxen, or other NSAIDS until after 7:00pm today if needed for pain.     Post Anesthesia Home Care Instructions  Activity: Get plenty of rest for the remainder of the day. A responsible individual must stay with you for 24 hours following the procedure.  For the next 24 hours, DO NOT: -Drive a car -Paediatric nurse -Drink alcoholic beverages -Take any medication unless instructed by your physician -Make any legal decisions or sign important papers.  Meals: Start with liquid foods such as gelatin or soup. Progress to regular foods as tolerated. Avoid greasy, spicy, heavy foods. If nausea and/or vomiting occur, drink only clear liquids until the nausea and/or vomiting subsides. Call your physician if vomiting continues.  Special Instructions/Symptoms: Your throat may feel dry or sore from the anesthesia or the breathing tube placed in your throat during surgery. If this causes discomfort, gargle with warm salt water. The discomfort should disappear within 24 hours.

## 2022-09-19 ENCOUNTER — Encounter (HOSPITAL_BASED_OUTPATIENT_CLINIC_OR_DEPARTMENT_OTHER): Payer: Self-pay | Admitting: Urology

## 2022-09-19 LAB — SURGICAL PATHOLOGY
